# Patient Record
Sex: Male | Born: 1940 | Race: Black or African American | Hispanic: No | Marital: Single | Smoking: Never smoker
Health system: Southern US, Community
[De-identification: ages and names within clinical notes are randomized; demographics above are authoritative.]

## PROBLEM LIST (undated history)

## (undated) DIAGNOSIS — I10 Essential (primary) hypertension: Secondary | ICD-10-CM

## (undated) DIAGNOSIS — E119 Type 2 diabetes mellitus without complications: Secondary | ICD-10-CM

## (undated) DIAGNOSIS — I639 Cerebral infarction, unspecified: Secondary | ICD-10-CM

## (undated) DIAGNOSIS — K635 Polyp of colon: Secondary | ICD-10-CM

## (undated) HISTORY — DX: Polyp of colon: K63.5

## (undated) HISTORY — DX: Type 2 diabetes mellitus without complications: E11.9

## (undated) HISTORY — PX: NO PAST SURGERIES: SHX2092

---

## 1999-06-30 ENCOUNTER — Emergency Department (HOSPITAL_COMMUNITY): Admission: EM | Admit: 1999-06-30 | Discharge: 1999-06-30 | Payer: Self-pay | Admitting: Emergency Medicine

## 2005-06-20 ENCOUNTER — Ambulatory Visit: Payer: Self-pay | Admitting: Physical Medicine & Rehabilitation

## 2005-06-20 ENCOUNTER — Inpatient Hospital Stay (HOSPITAL_COMMUNITY): Admission: EM | Admit: 2005-06-20 | Discharge: 2005-06-23 | Payer: Self-pay | Admitting: Emergency Medicine

## 2005-06-21 ENCOUNTER — Encounter (INDEPENDENT_AMBULATORY_CARE_PROVIDER_SITE_OTHER): Payer: Self-pay | Admitting: Cardiology

## 2005-06-23 ENCOUNTER — Inpatient Hospital Stay (HOSPITAL_COMMUNITY)
Admission: RE | Admit: 2005-06-23 | Discharge: 2005-07-25 | Payer: Self-pay | Admitting: Physical Medicine & Rehabilitation

## 2005-07-26 ENCOUNTER — Ambulatory Visit: Payer: Self-pay | Admitting: Nurse Practitioner

## 2005-08-12 ENCOUNTER — Encounter
Admission: RE | Admit: 2005-08-12 | Discharge: 2005-11-10 | Payer: Self-pay | Admitting: Physical Medicine & Rehabilitation

## 2005-08-12 ENCOUNTER — Ambulatory Visit: Payer: Self-pay | Admitting: Physical Medicine & Rehabilitation

## 2005-08-16 ENCOUNTER — Ambulatory Visit: Payer: Self-pay | Admitting: *Deleted

## 2005-08-19 ENCOUNTER — Encounter
Admission: RE | Admit: 2005-08-19 | Discharge: 2005-11-17 | Payer: Self-pay | Admitting: Physical Medicine and Rehabilitation

## 2005-09-22 ENCOUNTER — Ambulatory Visit: Payer: Self-pay | Admitting: Nurse Practitioner

## 2005-10-12 ENCOUNTER — Ambulatory Visit: Payer: Self-pay | Admitting: Physical Medicine & Rehabilitation

## 2005-11-24 ENCOUNTER — Encounter
Admission: RE | Admit: 2005-11-24 | Discharge: 2006-02-22 | Payer: Self-pay | Admitting: Physical Medicine and Rehabilitation

## 2005-12-26 ENCOUNTER — Ambulatory Visit: Payer: Self-pay | Admitting: Nurse Practitioner

## 2005-12-27 ENCOUNTER — Emergency Department (HOSPITAL_COMMUNITY): Admission: EM | Admit: 2005-12-27 | Discharge: 2005-12-27 | Payer: Self-pay | Admitting: *Deleted

## 2006-01-06 ENCOUNTER — Encounter
Admission: RE | Admit: 2006-01-06 | Discharge: 2006-04-06 | Payer: Self-pay | Admitting: Physical Medicine & Rehabilitation

## 2006-02-09 ENCOUNTER — Ambulatory Visit: Payer: Self-pay | Admitting: Physical Medicine & Rehabilitation

## 2006-03-24 ENCOUNTER — Ambulatory Visit: Payer: Self-pay | Admitting: *Deleted

## 2006-07-28 ENCOUNTER — Ambulatory Visit: Payer: Self-pay | Admitting: Nurse Practitioner

## 2006-09-07 ENCOUNTER — Ambulatory Visit: Payer: Self-pay | Admitting: Nurse Practitioner

## 2007-06-27 ENCOUNTER — Ambulatory Visit: Payer: Self-pay | Admitting: Internal Medicine

## 2007-06-27 ENCOUNTER — Encounter (INDEPENDENT_AMBULATORY_CARE_PROVIDER_SITE_OTHER): Payer: Self-pay | Admitting: *Deleted

## 2007-06-27 ENCOUNTER — Encounter (INDEPENDENT_AMBULATORY_CARE_PROVIDER_SITE_OTHER): Payer: Self-pay | Admitting: Nurse Practitioner

## 2007-06-27 LAB — CONVERTED CEMR LAB
Basophils Absolute: 0 10*3/uL (ref 0.0–0.1)
Basophils Relative: 1 % (ref 0–1)
CO2: 24 meq/L (ref 19–32)
Eosinophils Absolute: 0.2 10*3/uL (ref 0.0–0.7)
Eosinophils Relative: 5 % (ref 0–5)
Glucose, Bld: 104 mg/dL — ABNORMAL HIGH (ref 70–99)
Hemoglobin: 14.1 g/dL (ref 13.0–17.0)
Lymphocytes Relative: 38 % (ref 12–46)
MCHC: 33.3 g/dL (ref 30.0–36.0)
MCV: 88.9 fL (ref 78.0–100.0)
Monocytes Absolute: 0.4 10*3/uL (ref 0.2–0.7)
Neutrophils Relative %: 47 % (ref 43–77)
PSA: 1.12 ng/mL (ref 0.10–4.00)
Platelets: 192 10*3/uL (ref 150–400)
Potassium: 4.3 meq/L (ref 3.5–5.3)
RDW: 14.4 % — ABNORMAL HIGH (ref 11.5–14.0)
Total Bilirubin: 0.6 mg/dL (ref 0.3–1.2)

## 2008-01-23 ENCOUNTER — Encounter (INDEPENDENT_AMBULATORY_CARE_PROVIDER_SITE_OTHER): Payer: Self-pay | Admitting: Nurse Practitioner

## 2008-01-23 ENCOUNTER — Ambulatory Visit: Payer: Self-pay | Admitting: Internal Medicine

## 2008-01-23 LAB — CONVERTED CEMR LAB
ALT: 21 units/L (ref 0–53)
AST: 18 units/L (ref 0–37)
Albumin: 4.5 g/dL (ref 3.5–5.2)
BUN: 15 mg/dL (ref 6–23)
Basophils Absolute: 0 10*3/uL (ref 0.0–0.1)
Basophils Relative: 1 % (ref 0–1)
CO2: 25 meq/L (ref 19–32)
Chloride: 104 meq/L (ref 96–112)
Creatinine, Ser: 0.89 mg/dL (ref 0.40–1.50)
Eosinophils Absolute: 0.2 10*3/uL (ref 0.0–0.7)
Eosinophils Relative: 4 % (ref 0–5)
Glucose, Bld: 112 mg/dL — ABNORMAL HIGH (ref 70–99)
LDL Cholesterol: 98 mg/dL (ref 0–99)
Lymphs Abs: 1.6 10*3/uL (ref 0.7–4.0)
MCHC: 33.7 g/dL (ref 30.0–36.0)
Microalb, Ur: 0.98 mg/dL (ref 0.00–1.89)
Platelets: 210 10*3/uL (ref 150–400)
Sodium: 142 meq/L (ref 135–145)
TSH: 1.173 microintl units/mL (ref 0.350–5.50)
Total Bilirubin: 0.6 mg/dL (ref 0.3–1.2)
Total CHOL/HDL Ratio: 2.7
Total Protein: 7.9 g/dL (ref 6.0–8.3)
Triglycerides: 103 mg/dL (ref <150)

## 2009-03-12 ENCOUNTER — Encounter (INDEPENDENT_AMBULATORY_CARE_PROVIDER_SITE_OTHER): Payer: Self-pay | Admitting: Internal Medicine

## 2009-03-12 ENCOUNTER — Ambulatory Visit: Payer: Self-pay | Admitting: Internal Medicine

## 2009-03-12 LAB — CONVERTED CEMR LAB
AST: 23 units/L (ref 0–37)
Alkaline Phosphatase: 69 units/L (ref 39–117)
Calcium: 9.5 mg/dL (ref 8.4–10.5)
Eosinophils Absolute: 0.2 10*3/uL (ref 0.0–0.7)
HCT: 41.7 % (ref 39.0–52.0)
Hemoglobin: 13.4 g/dL (ref 13.0–17.0)
MCHC: 32.1 g/dL (ref 30.0–36.0)
Neutrophils Relative %: 53 % (ref 43–77)
Sodium: 142 meq/L (ref 135–145)

## 2009-04-08 ENCOUNTER — Encounter (INDEPENDENT_AMBULATORY_CARE_PROVIDER_SITE_OTHER): Payer: Self-pay | Admitting: Internal Medicine

## 2009-04-08 ENCOUNTER — Ambulatory Visit: Payer: Self-pay | Admitting: Internal Medicine

## 2009-04-08 LAB — CONVERTED CEMR LAB
Cholesterol: 194 mg/dL (ref 0–200)
HDL: 71 mg/dL (ref >39)
LDL Cholesterol: 109 mg/dL — ABNORMAL HIGH (ref 0–99)
PSA: 1.47 ng/mL (ref 0.10–4.00)
Triglycerides: 71 mg/dL (ref <150)

## 2009-06-08 ENCOUNTER — Ambulatory Visit: Payer: Self-pay | Admitting: Internal Medicine

## 2009-06-18 ENCOUNTER — Ambulatory Visit: Payer: Self-pay | Admitting: Internal Medicine

## 2009-10-29 ENCOUNTER — Ambulatory Visit: Payer: Self-pay | Admitting: Internal Medicine

## 2009-12-10 ENCOUNTER — Ambulatory Visit: Payer: Self-pay | Admitting: Family Medicine

## 2009-12-10 ENCOUNTER — Encounter (INDEPENDENT_AMBULATORY_CARE_PROVIDER_SITE_OTHER): Payer: Self-pay | Admitting: Internal Medicine

## 2009-12-10 LAB — CONVERTED CEMR LAB
ALT: 25 units/L (ref 0–53)
AST: 21 units/L (ref 0–37)
Albumin: 4.2 g/dL (ref 3.5–5.2)
Alkaline Phosphatase: 73 units/L (ref 39–117)
Chloride: 103 meq/L (ref 96–112)
Cholesterol: 197 mg/dL (ref 0–200)
Creatinine, Ser: 0.99 mg/dL (ref 0.40–1.50)
LDL Cholesterol: 112 mg/dL — ABNORMAL HIGH (ref 0–99)
Sodium: 140 meq/L (ref 135–145)
Total Protein: 7.2 g/dL (ref 6.0–8.3)
Triglycerides: 116 mg/dL (ref <150)
VLDL: 23 mg/dL (ref 0–40)

## 2010-01-01 ENCOUNTER — Ambulatory Visit: Payer: Self-pay | Admitting: Internal Medicine

## 2011-02-25 NOTE — Assessment & Plan Note (Signed)
HISTORY OF PRESENT ILLNESS:  Kent Williams returns to the clinic today for  follow up evaluation.  He is a 70 year old adult male who suffered a right  hemispheric stroke resulting in right sided weakness approximately early  September 2006.  The patient continues to attend outpatient therapy at  [facility] including PT/OT two times per week.  He needs some help with  bathing, but is able to mostly dress himself at the present time.  He  reports that he is continent of bowel and bladder.  He reports that he gets  primary care through [facility].  He is not sure exactly what numbers he  has been getting with his blood sugar checks, but he has been doing that on  a regular basis. He is using a single point cane for ambulation at the  present time.  He continues to live alone and drove himself into the office  today.   MEDICATIONS:  1.  Glucophage XR 500 mg one tablet q.a.m. and two tablets q.p.m.  2.  Avandia 6 mg daily.  3.  Hydrochlorothiazide 50 mg daily.  4.  K-Dur 20 mEq daily.  5.  Avapro 300 mg daily.  6.  Aggrenox 25/200 one tablet b.i.d.  7.  Lipitor 20 mg one tablet q.h.s.   REVIEW OF SYSTEMS:  Positive for limb swelling, especially of the hand.   PHYSICAL EXAMINATION:  GENERAL APPEARANCE:  Reasonably well-appearing adult  male in no acute discomfort.  VITAL SIGNS:  Blood pressure 117/56, pulse 77, respirations 16, O2  saturation 99% on room air.  He is seated in a regular chair and ambulates  with hemiparetic gait, mildly on the left with a single point cane.  Right  upper and right lower extremity strength was 5/5.  Left upper extremity  strength distally was 2/5 and proximally 3-/5.  Left leg strength was 4+/5.   IMPRESSION:  1.  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  2.  Non-insulin-dependent diabetes mellitus.  3.  Hypertension.  4.  Dyslipidemia.   PLAN:  At the present time, the patient will continue in outpatient  therapies.  He  is making excellent progress overall, and I would anticipate  that he will continue to require some assistance with occasional bathing and  dressing, given his left arm use that he has at present.  Hopefully, there  will be some improvement.  He has made excellent improvement in terms of  ability.  He is using a single point cane for ambulation at the present  time.  He is continent of bowel and bladder and is able to take care of  himself at a basic level.   We will plan on seeing the patient in follow up in this office in  approximately three months time.  He will continue to get his primary care  through [facility].           ______________________________  Ellwood Dense, M.D.     DC/MedQ  D:  10/13/2005 10:27:21  T:  10/13/2005 10:55:32  Job #:  [id number]

## 2011-02-25 NOTE — Discharge Summary (Signed)
NAMEDOMNIC, Kent Williams               ACCOUNT NO.:  [id number]   MEDICAL RECORD NO.:  [id number]          PATIENT TYPE:  INP   LOCATION:  3736                         FACILITY:  [facility]   PHYSICIAN:  Pramod P. Pearlean Brownie, MD    DATE OF BIRTH:  10-10-1941   DATE OF ADMISSION:  06/20/2005  DATE OF DISCHARGE:  06/23/2005                                 DISCHARGE SUMMARY   ADMISSION DIAGNOSIS:  Stroke   DISCHARGE DIAGNOSES:  1.  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  2.  Uncontrolled diabetes.  3.  Hypertension.  4.  Hyperlipidemia.   HOSPITAL COURSE:  [name] is a 64-year African-American male who was  admitted with sudden onset of dysarthria and left hemiparesis for three  days. He presented beyond the time of acute intervention. Admission CT scan  of the head showed a subacute hypodensity in the right coronary area, the  possibility of an infarct. Admission labs were significant only for elevated  blood glucose of 346. The patient was admitted to the stroke unit for  telemetry monitoring, did not reveal cardiac arrhythmias. An MRI scan of the  brain the next day showed a right internal capsule infarction. 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 Carotid ultrasound  showed no significant stenosis. The right vertebral artery was not  visualized due to technical limitations. A 2-D echocardiogram showed normal  ejection fraction without any cardiac source of embolism.   Stroke labs revealed a significantly elevated hemoglobin A1c of 13.2.  Cholesterol was elevated at 277 and ADL at 131. The patient was started on  Lipitor for his elevated cholesterol and on Aggrenox for secondary stroke  prevention. He was continued on Avapro and hydrochlorothiazide, his home  blood pressure medications. He required Glucophage for his diabetes and  sliding scale  coverage with insulin. His sugars were controlled during  hospitalization. Initially his sugars were high, but after . medications  were adjusted they were better controlled. He was given, counseling for  diabetes control.  On exam the patient had mild left facial weakness and  significant left hemiparesis with grade 0/5 strength in left upper extremity  and grade 4/5 strength at the left hip and knees and 0/5 at the ankles.  He  was seen on consultation by physical, occupational, and speech therapies.  Rehabilitation was also consulted and they felt the patient was a good  candidate for inpatient rehab; and, hence, he was transferred in stable  condition to rehab on 06/23/2005.   DISCHARGE MEDICATIONS:  1.  Aggrenox 1 capsule daily for 2 weeks and then increase to twice a day.  2.  Avapro 150 mg daily.  3.  Lovenox 40 mg daily for thrombosis prophylaxis.  4.  Hydrochlorothiazide 50 mg daily.  5.  Glucophage 1000 mg at night.  6.  Zocor 40 mg daily.  7.  Sliding scale insulin coverage.   The patient was advised to followup with his primary physician, Dr. Adela Lank  Williams, in a few weeks and with Dr. Pearlean Brownie in his office in 2 months.           ______________________________  Kent Williams. Pearlean Brownie, MD     PPS/MEDQ  D:  06/23/2005  T:  06/23/2005  Job:  [id number]   cc:   Kent Williams, M.D.  [phone] [address]  [place]  [place] [address]  Fax: [phone]

## 2011-02-25 NOTE — Assessment & Plan Note (Signed)
[name] returns to the clinic today for follow-up evaluation.  He is a 70-  year-old adult male who suffered a right hemisphere stroke resulting in left-  sided weakness, arm more than leg and that occurred September 2006.  He has  finished all therapy at this time.  He continues to do a home exercise  program.  He can walk approximately a half mile before resting using a  single point cane but occasionally goes without the cane.  He does live  alone.  He eats out at food kitchen periodically and also drives his own car  to eat out periodically.  He reports that his recent blood sugars has been  approximately 93.  He does receive health care through [facility].   MEDICATIONS:  1.  Glucophage XR 500 mg 1 tablet q.a.m. and 2 tablets q.p.m.  2.  Avandia 6 mg daily.  3.  Hydrochlorothiazide 15 mg daily.  4.  K-Dur 20 mEq daily.  5.  Avapro 300 mg daily.  6.  Aggrenox 25/200 1 tablet b.i.d.  7.  Lipitor 20 mg q.h.s.   REVIEW OF SYSTEMS:  Noncontributory.   PHYSICAL EXAMINATION:  A reasonably well-appearing middle-aged adult male in  no acute discomfort.  Blood pressure 149/80 with pulse 80, respiratory rate  16 and O2 saturation 100% on room air.  Right upper and right lower  extremity strength was 5/5 throughout.  Left upper extremity strength with  2/5 distally and 3-/5 proximally.  Left lower extremity strength was 4+/5.   IMPRESSION:  1.  Status post right hemisphere stroke with left hemiplegia.  2.  Noninsulin-dependent diabetes mellitus.  3.  Hypertension.  4.  Dyslipidemia.   In the office today, no refill on medication is necessary.  He has completed  all therapy and I have encouraged him to do home exercise program.  He is to  walk with and without a single point cane at this point.  At this point, we  will plan on seeing him in follow up on an as needed basis.  We have  referred him back to [facility] for ongoing medical treatment.  He seems to  be stable overall and  hopefully can avoid future strokes if he manages his  blood pressure, lipid levels and noninsulin-dependent diabetes mellitus.           ______________________________  Ellwood Dense, M.D.     DC/MedQ  D:  02/10/2006 16:16:39  T:  02/11/2006 21:21:11  Job #:  [id number]

## 2011-02-25 NOTE — Discharge Summary (Signed)
Kent Williams, Kent Williams               ACCOUNT NO.:  [id number]   MEDICAL RECORD NO.:  [id number]          PATIENT TYPE:  IPS   LOCATION:  4032                         FACILITY:  [facility]   PHYSICIAN:  Greg Cutter, P.A. DATE OF BIRTH:  10/25/1940   DATE OF ADMISSION:  06/23/2005  DATE OF DISCHARGE:  07/25/2005                                 DISCHARGE SUMMARY   DISCHARGE DIAGNOSES:  1.  Right basal ganglia infarct.  2.  Hypertension.  3.  Diabetes mellitus type 2.  4.  Dyslipidemia.   HISTORY OF PRESENT ILLNESS:  Kent Williams is a 70 year old right-handed male  with history of hypertension, diabetes mellitus, no medications, no BP  medications x 3 months, no diabetic medications x 2 days, admitted September  11 with left-sided weakness and dizziness.  CT of the head done shows acute  chronic white matter disease.  MRI brain showed a white matter infarct with  __________ internal capsule, basal ganglia.  MRA showed no lesion.  Cardiac  __________ 2-D echo 55-65% without thrombus.  Carotid Doppler showed no  acute stenosis.  The patient was started on __________  the patient also  noted to have increased cholesterol at 277 and LDL 191 and was started on  Lipitor.  Hemoglobin A1c was elevated at admission at 13.2 and blood sugars  have continued to be variable.  BP has been labile and the patient was  started on HCTZ and nitroglycerin paste.  Currently, the patient continues  with left hemiparesis, mild dysarthria and impaired balance requiring total  assist for mobility, min to moderate assist for ADLs.  Rehab was consulted  for further therapy.   PAST MEDICAL HISTORY:  Significant for hypertension, DM type 2, medical  noncompliance secondary to financial issues, history of renal calculi  requiring lithotripsy.   ALLERGIES:  NO KNOWN DRUG ALLERGIES.   FAMILY HISTORY:  Questionable for cancer.   SOCIAL HISTORY:  The patient lives alone, working part time and was  independent prior  to admission.  Uses beer occasionally.  Does not use any  tobacco.   HOSPITAL COURSE:  Kent Williams was admitted to rehab on June 23, 2005 for inpatient therapy to consist of PT and OT and speech therapy.  After admission, the patient was maintained on Aggrenox prophylaxis and DVT  prophylaxis.  Blood pressures were monitored initially on __________, HCTZ  and nitro paste.  Diabetes was monitored with a.c. and h.s. CBG checks.  BP  medications were adjusted during his stay.  Blood pressures at time of  discharge showed improving control from 120s-140s systolics, 70-80s  diastolic.  Glucophage was titrated upward and his blood sugars at discharge  were 80s to 140s.  The patient's mood was stable and he participated well in  therapies.  Speech therapy evaluation showed the patient's basic and high  level comprehension to be intact, basic __________ expression to be intact  without signs of apraxia or dysarthria.  The patient continued with left  hemiparesis, slowly improving.  By time of discharge, the patient was  modified independent for transfers, modified independent for standing  balance, modified independent for ambulating greater than 200 feet with a  rolling walker, modified independent to navigate stairs.  He was supervision  for bathing, modified independent for upper body dressing, min assist for  lower body dressing in terms of donning sock and shoe.  He was modified  independent for toileting.  At time of discharge, he was noted to have  advanced from stage 3 in the left arm and stage 2 in hand with free finger  movement.   Further followup and therapies to include home health, PT, OT by [facility] at discharge.  The patient was set up to follow up with  [facility] to help assist with medications.  He was also set up with mobile  meals.   DISCHARGE HOME MEDICATIONS:  1.  Glucophage XR 500 mg in a.m. and 150 mg in p.m.  2.  Avandia 6 mg per day.  3.   HCTZ 50 mg a day.  4.  K-Dur 20 mEq a day.  5.  Avapro 300 mg a day.  6.  Aggrenox one p.o. b.i.d.  7.  Lipitor 20 mg q.h.s.   DIET:  Diabetic diet.   ACTIVITY:  As tolerated.  Use a walker.  Increase activity slowly.  No  driving for 4-6 weeks until followup with LMD.   SPECIAL INSTRUCTIONS:  1.  [facility] appointment October 16 at 2 p.m.  2.  [facility] eligibility appointment, Thursday October 19 at 3:15.  3.  [facility] to provide PT, OT, RN.   FOLLOWUP:  1.  The patient is to follow up with Dr. Thomasena Edis November 6 at 11:40 a.m.  2.  Follow up with Dr. Pearlean Brownie in 1-2 months.  3.  Follow up with LMD, [facility] for all medical issues.      Greg Cutter, P.A.     PP/MEDQ  D:  10/31/2005  T:  11/01/2005  Job:  [id number]   cc:   Dr. Carol Ada P. Pearlean Brownie, MD  Fax: [phone]

## 2011-02-25 NOTE — H&P (Signed)
Kent Williams, Kent Williams               ACCOUNT NO.:  [id number]   MEDICAL RECORD NO.:  [id number]          PATIENT TYPE:  INP   LOCATION:  3736                         FACILITY:  [facility]   PHYSICIAN:  Genene Churn. Love, M.D.    DATE OF BIRTH:  1940/12/01   DATE OF ADMISSION:  06/20/2005  DATE OF DISCHARGE:                                HISTORY & PHYSICAL   This is the first [facility] admission for this 70 year old right-  handed black single male from [place], [place] admitted for  evaluation of left-sided weakness.   HISTORY OF PRESENT ILLNESS:  Kent Williams has a 10-year history of  hypertension and a 5-year history of diabetes mellitus without a known  history of stroke or heart disease. He has not taken his blood pressure in  over one to three months. He has not taken his diabetic medication in three  days.   Today, about 2:30 p.m. while at [employer], he developed the onset of  dizziness described as a vague lightheaded sensation without associated  headaches, syncope, or seizure followed by left-sided weakness, and was  brought to the [facility] Emergency Room. He has not been on any  aspirin therapy.   PAST MEDICAL HISTORY:  1.  Diabetes mellitus for five years.  2.  Hypertension for ten years.  3.  Kidney stones ten years ago.   ALLERGIES:  He has no known history of allergies.   SOCIAL HISTORY:  He does not smoke cigarettes. Education:  He went through  [employer] for two years and [employer] for two years but did  not graduate. 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 He is unmarried.   MEDICATIONS:  1.  Glucophage dosage 1 daily.  2.  Blood pressure unknown.  3.  No aspirin.   FAMILY HISTORY:  His mother died at 61 of unknown causes. His father died at  24 following an ulcer operation. He has a brother 68 living and well, a  brother 68 living and well. He had a brother who died at 62 of  unknown  causes. Brother died at 68 unknown causes, possibly lung cancer. He has a  sister who is 84 and 32 living and well.   PAST MEDICAL HISTORY:  Reveals that he has been hospitalized 10 years ago  for kidney stones and had what sounds to have been lithotripsy or basket  extraction.   PHYSICAL EXAMINATION:  GENERAL:  Well-developed black male.  VITAL SIGNS:  Blood pressure right arm was 210/100, left arm 190/100, heart  rate was 92 and regular. There were no bruits.  NEUROLOGICAL:  He was alert and oriented x2. There was no aphasia, no  apraxia. Cranial nerve examination revealed visual fields full, disks flat.  He had a left 7th. Tongue was to the left. He had pinprick equal in the  face. Hearing was intact. Air conduction was greater than bone conduction.  He had some decrease left shoulder shrug. Motor examination revealed a left  hand and  arm drift. He had mild left leg drift and inability to lift his  left arm versus gravity. He had no evidence of any sensory loss. Deep tendon  reflexes were 2+. Left plantar responses were upgoing. Right plantar  response was downgoing. The weakness was all on the left side.  HEENT:  Tympanic membrane clear.  LUNGS:  Clear to P&A.  HEART:  No murmurs.  ABDOMEN:  Bowel sounds were normal. There was no enlarged liver, spleen, or  kidneys.  GENITAL:  He was not circumcised.   LABORATORY DATA:  CT scan showed lacunar strokes, ischemic in the right  corona radiata. EKG showed normal sinus rhythm, nonspecific ST and T-wave  changes with an INR of 0.9, PTT 25. Sodium 134, potassium 4.0, chloride 104,  CO2 content 12, glucose 346. His BUN was 12. His white blood cell count was  5500, hemoglobin 14.4, hematocrit 43.1, platelet count 159,000.   IMPRESSION:  1.  Right brain small vessel disease, lacunar stroke, code 434.1.  2.  Hypertension uncontrolled, code 796.2.  3.  Diabetes mellitus, uncontrolled, code 250.60.  4.  Obesity, code 278.01.  5.   Noncompliance with medications.  6.  History of renal stones.   PLAN:  Plan at this time is to admit the patient, give him IV labetalol push  and see if he is a candidate for any of the studies today. If not, I will  place him on rectal aspirin and Lovenox.           ______________________________  Genene Churn. Sandria Manly, M.D.     JML/MEDQ  D:  06/20/2005  T:  06/21/2005  Job:  [id number]   cc:   Lorelle Formosa, M.D.  [phone] [address]  [place]  [place] [address]  Fax: [phone]

## 2011-02-25 NOTE — Assessment & Plan Note (Signed)
HISTORY OF PRESENT ILLNESS:  Kent Williams returns to the clinic today for  follow up evaluation.  He is a 70 year old adult male who has suffered an  obvious right hemispheric stroke resulting in left hemiplegia.  Unfortunately, I do not have a discharge summary available as of August 12, 2005, and therefore, I do not have all the specifics regarding his  hospitalization.  It does appear that he was on the rehabilitation unit  starting June 23, 2005, but I do not have a discharge date.  He was set  up to be seen in the office today, and also was set up with [facility] as  of July 25, 2005.  He did go to [facility] and has received his  medicines through their office.   The patient does need a refill on his Aggrenox in the office today.  He  reports that he has not been able to check his blood sugar and has not been  scheduled for a follow up appointment with [facility].  They apparently  told him that when he runs low on his medications, he needs to call several  days in advance.  He is also being treated with Avapro and  hydrochlorothiazide for blood pressure control and Lipitor for  hypercholesterolemia.   Unfortunately, Home Health therapy tried to get into the patient's home, but  it was too small.  He reports that he has cleaned up his home some, and they  may be able to manage getting in, but he also reports that he has  transportation available for outpatient therapies.   MEDICATIONS:  1.  Glucophage XR 500 mg one tablet q.a.m. and three tablets q.p.m.  2.  Avandia 6 mg one tablet daily.  3.  Hydrochlorothiazide 50 mg one tablet daily.  4.  K-Dur 20 mEq one tablet daily.  5.  Avapro 300 mg one tablet daily.  6.  Aggrenox 25/200 one tablet b.i.d.  7.  Lipitor 20 mg one tablet p.o. q.h.s.   REVIEW OF SYSTEMS:  Noncontributory.   PHYSICAL EXAMINATION:  GENERAL APPEARANCE:  Reasonably well-appearing,  middle-aged adult male in no acute discomfort.  He is seated  in a regular  wheelchair and is able to use the rolling walker with wrist brace on his  left side to strap his left hand around the walker.  He probably has 4+/5  strength in hip flexion and knee extension on the left side, and his left  upper extremity shows 0/5 in grip with elbow flexion of 4/5 and elbow  extension of 4-/5.  Shoulder abduction was 3-/5 on the left.   IMPRESSION:  1.  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  2.  Non-insulin-dependent diabetes mellitus.  3.  Hypertension.  4.  Hyperlipidemia.   PLAN:  In the office today, we did set the patient up for a follow up  appointment in this office in two months.  We also set him up for outpatient  PT/OT on [address]  We have refilled his Aggrenox.  We have asked him  to follow up with [facility] for management of his hypertension and  diabetes along with refill of medication.  We plan on seeing him in follow  up in this office in approximately two months time.          ______________________________  Kent Williams, M.D.    DC/MedQ  D:  08/15/2005 12:07:04  T:  08/15/2005 12:41:41  Job #:  [id number]

## 2013-11-18 ENCOUNTER — Emergency Department (HOSPITAL_COMMUNITY): Payer: No Typology Code available for payment source

## 2013-11-18 ENCOUNTER — Emergency Department (HOSPITAL_COMMUNITY)
Admission: EM | Admit: 2013-11-18 | Discharge: 2013-11-18 | Disposition: A | Discharge status: Home / Self Care | Payer: No Typology Code available for payment source | Attending: Emergency Medicine | Admitting: Emergency Medicine

## 2013-11-18 ENCOUNTER — Encounter (HOSPITAL_COMMUNITY): Payer: Self-pay | Admitting: Emergency Medicine

## 2013-11-18 DIAGNOSIS — S99919A Unspecified injury of unspecified ankle, initial encounter: Principal | ICD-10-CM

## 2013-11-18 DIAGNOSIS — Y92410 Unspecified street and highway as the place of occurrence of the external cause: Secondary | ICD-10-CM | POA: Insufficient documentation

## 2013-11-18 DIAGNOSIS — IMO0002 Reserved for concepts with insufficient information to code with codable children: Secondary | ICD-10-CM | POA: Insufficient documentation

## 2013-11-18 DIAGNOSIS — Z8673 Personal history of transient ischemic attack (TIA), and cerebral infarction without residual deficits: Secondary | ICD-10-CM | POA: Insufficient documentation

## 2013-11-18 DIAGNOSIS — M25561 Pain in right knee: Secondary | ICD-10-CM

## 2013-11-18 DIAGNOSIS — M25519 Pain in unspecified shoulder: Secondary | ICD-10-CM

## 2013-11-18 DIAGNOSIS — S99929A Unspecified injury of unspecified foot, initial encounter: Principal | ICD-10-CM

## 2013-11-18 DIAGNOSIS — Z7982 Long term (current) use of aspirin: Secondary | ICD-10-CM | POA: Insufficient documentation

## 2013-11-18 DIAGNOSIS — Z79899 Other long term (current) drug therapy: Secondary | ICD-10-CM | POA: Insufficient documentation

## 2013-11-18 DIAGNOSIS — M25562 Pain in left knee: Secondary | ICD-10-CM

## 2013-11-18 DIAGNOSIS — S4980XA Other specified injuries of shoulder and upper arm, unspecified arm, initial encounter: Secondary | ICD-10-CM | POA: Insufficient documentation

## 2013-11-18 DIAGNOSIS — S8990XA Unspecified injury of unspecified lower leg, initial encounter: Secondary | ICD-10-CM | POA: Insufficient documentation

## 2013-11-18 DIAGNOSIS — Y9389 Activity, other specified: Secondary | ICD-10-CM | POA: Insufficient documentation

## 2013-11-18 DIAGNOSIS — I10 Essential (primary) hypertension: Secondary | ICD-10-CM | POA: Insufficient documentation

## 2013-11-18 DIAGNOSIS — M545 Low back pain, unspecified: Secondary | ICD-10-CM

## 2013-11-18 DIAGNOSIS — S46909A Unspecified injury of unspecified muscle, fascia and tendon at shoulder and upper arm level, unspecified arm, initial encounter: Secondary | ICD-10-CM | POA: Insufficient documentation

## 2013-11-18 HISTORY — DX: Essential (primary) hypertension: I10

## 2013-11-18 HISTORY — DX: Cerebral infarction, unspecified: I63.9

## 2013-11-18 MED ORDER — OXYCODONE-ACETAMINOPHEN 5-325 MG PO TABS
1.0000 | ORAL_TABLET | Freq: Once | ORAL | Status: AC
  Administered 2013-11-18: 1 via ORAL
  Filled 2013-11-18: qty 1

## 2013-11-18 MED ORDER — OXYCODONE-ACETAMINOPHEN 5-325 MG PO TABS: 1.0000 | ORAL_TABLET | Freq: Four times a day (QID) | ORAL | Status: DC | PRN

## 2013-11-18 NOTE — ED Notes (Signed)
Pt state he also has R shoulder pain

## 2013-11-18 NOTE — Discharge Instructions (Signed)
Return to the ED with any concerns including weakness of legs, not able to urinate, loss of control of bowel or bladder, chest or abdominal pain, difficulty breathing, decreased level of alertness/lethargy, or any other alarming symptoms  The xray of your back had some chronic appearing abnormalitlies, no acute fractures were found,  If your back continues to cause pain you should be sure to see your doctor for a recheck
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
## 2016-12-12 ENCOUNTER — Emergency Department (HOSPITAL_COMMUNITY)
Admission: EM | Admit: 2016-12-12 | Discharge: 2016-12-12 | Disposition: A | Discharge status: Home / Self Care | Payer: Medicare HMO | Attending: Emergency Medicine | Admitting: Emergency Medicine

## 2016-12-12 ENCOUNTER — Encounter (HOSPITAL_COMMUNITY): Payer: Self-pay

## 2016-12-12 DIAGNOSIS — Z7984 Long term (current) use of oral hypoglycemic drugs: Secondary | ICD-10-CM | POA: Diagnosis not present

## 2016-12-12 DIAGNOSIS — L259 Unspecified contact dermatitis, unspecified cause: Secondary | ICD-10-CM | POA: Diagnosis not present

## 2016-12-12 DIAGNOSIS — I10 Essential (primary) hypertension: Secondary | ICD-10-CM | POA: Diagnosis not present

## 2016-12-12 DIAGNOSIS — Z7982 Long term (current) use of aspirin: Secondary | ICD-10-CM | POA: Diagnosis not present

## 2016-12-12 DIAGNOSIS — R21 Rash and other nonspecific skin eruption: Secondary | ICD-10-CM | POA: Diagnosis present

## 2016-12-12 DIAGNOSIS — Z8673 Personal history of transient ischemic attack (TIA), and cerebral infarction without residual deficits: Secondary | ICD-10-CM | POA: Diagnosis not present

## 2016-12-12 DIAGNOSIS — Z79899 Other long term (current) drug therapy: Secondary | ICD-10-CM | POA: Insufficient documentation

## 2016-12-12 MED ORDER — TRIAMCINOLONE ACETONIDE 0.1 % EX CREA
TOPICAL_CREAM | Freq: Once | CUTANEOUS | Status: AC
  Administered 2016-12-12: 03:00:00 via TOPICAL
  Filled 2016-12-12: qty 15

## 2016-12-12 NOTE — ED Triage Notes (Signed)
Pt complaining of rash to L arm. Pt states believes is shingles. Pt with OLD stroke, L sided deficits. Pt complaining of itching rash, denies any pain.

## 2016-12-12 NOTE — ED Provider Notes (Signed)
[facility]-EMERGENCY DEPT Provider Note   CSN: [id number] Arrival date & time: 12/12/16  0214  By signing my name below, I, Kent Williams, attest that this documentation has been prepared under the direction and in the presence of Kent Crumble, MD. Electronically Signed: Modena Williams, Scribe. 12/12/2016. 2:22 AM.  History   Chief Complaint Chief Complaint  Patient presents with  . Rash   The history is provided by the patient. No language interpreter was used.   HPI Comments: Kent Williams is a 76 y.o. male who presents to the Emergency Department complaining of a constant moderate LUE rash that started about a week ago. He states he is concerned about shingles. He applied Hydrocortisone cream with some relief. He describes his rash as itchy and painful ("slightly"). He reports associated LUE and LLE weakness from prior stroke. He denies any other complaints.     PCP: [name] (Inactive)  Past Medical History:  Diagnosis Date  . Hypertension   . Stroke [facility])     There are no active problems to display for this patient.   History reviewed. No pertinent surgical history.     Home Medications    Prior to Admission medications   Medication Sig Start Date End Date Taking? Authorizing Provider  aspirin 81 MG chewable tablet Chew 81 mg by mouth daily.    Historical Provider, MD  chlorthalidone (HYGROTON) 25 MG tablet Take 25 mg by mouth daily.    Historical Provider, MD  diltiazem (DILACOR XR) 120 MG 24 hr capsule Take 120 mg by mouth at bedtime.    Historical Provider, MD  diltiazem (DILACOR XR) 180 MG 24 hr capsule Take 180 mg by mouth every morning.    Historical Provider, MD  glipiZIDE (GLUCOTROL XL) 2.5 MG 24 hr tablet Take 2.5 mg by mouth at bedtime.    Historical Provider, MD  ibuprofen (ADVIL,MOTRIN) 200 MG tablet Take 200 mg by mouth every 6 (six) hours as needed for moderate pain.    Historical Provider, MD  losartan (COZAAR) 100 MG tablet Take 100 mg by mouth  daily.    Historical Provider, MD  metFORMIN (GLUCOPHAGE) 1000 MG tablet Take 1,000 mg by mouth 2 (two) times daily with a meal.    Historical Provider, MD  oxyCODONE-acetaminophen (PERCOCET/ROXICET) 5-325 MG per tablet Take 1-2 tablets by mouth every 6 (six) hours as needed for severe pain. 11/18/13   [name], MD  pravastatin (PRAVACHOL) 10 MG tablet Take 10 mg by mouth at bedtime.    Historical Provider, MD    Family History History reviewed. No pertinent family history.  Social History Social History  Substance Use Topics  . Smoking status: Never Smoker  . Smokeless tobacco: Never Used  . Alcohol use No     Allergies   Patient has no known allergies.   Review of Systems Review of Systems A complete 10 system review of systems was obtained and all systems are negative except as noted in the HPI and PMH.   Physical Exam Updated Vital Signs BP 140/97 (BP Location: Right Arm)   Pulse 65   Temp 97.8 F (36.6 C) (Oral)   Resp 16   SpO2 97%   Physical Exam  Constitutional: He is oriented to person, place, and time. Vital signs are normal. He appears well-developed and well-nourished.  Non-toxic appearance. He does not appear ill. No distress.  HENT:  Head: Normocephalic and atraumatic.  Nose: Nose normal.  Mouth/Throat: Oropharynx is clear and moist. No oropharyngeal exudate.  Eyes: Conjunctivae and EOM are normal. Pupils are equal, round, and reactive to light. No scleral icterus.  Neck: Normal range of motion. Neck supple. No tracheal deviation, no edema, no erythema and normal range of motion present. No thyroid mass and no thyromegaly present.  Cardiovascular: Normal rate, regular rhythm, S1 normal, S2 normal, normal heart sounds, intact distal pulses and normal pulses.  Exam reveals no gallop and no friction rub.   No murmur heard. Pulmonary/Chest: Effort normal and breath sounds normal. No respiratory distress. He has no wheezes. He has no rhonchi. He has no rales.    Abdominal: Soft. Normal appearance and bowel sounds are normal. He exhibits no distension, no ascites and no mass. There is no hepatosplenomegaly. There is no tenderness. There is no rebound, no guarding and no CVA tenderness.  Musculoskeletal: Normal range of motion. He exhibits no edema or tenderness.  Lymphadenopathy:    He has no cervical adenopathy.  Neurological: He is alert and oriented to person, place, and time. He has normal strength. No cranial nerve deficit or sensory deficit.  LUE and LLE weakness.   Skin: Skin is warm, dry and intact. Rash noted. No petechiae noted. He is not diaphoretic. No erythema. No pallor.  LUE: Very small 2 cm circular rash. Rash is skin-colored, flat, and not draining. No warmth or TTP.   Nursing note and vitals reviewed.    ED Treatments / Results  DIAGNOSTIC STUDIES: Oxygen Saturation is 97% on RA, normal by my interpretation.    COORDINATION OF CARE: 2:26 AM- Pt advised of plan for treatment and pt agrees.  Labs (all labs ordered are listed, but only abnormal results are displayed) Labs Reviewed - No data to display  EKG  EKG Interpretation None       Radiology No results found.  Procedures Procedures (including critical care time)  Medications Ordered in ED Medications - No data to display   Initial Impression / Assessment and Plan / ED Course  I have reviewed the triage vital signs and the nursing notes.  Pertinent labs & imaging results that were available during my care of the patient were reviewed by me and considered in my medical decision making (see chart for details).     Patient presents to the ED for a rash.  It appears to be a contact dermatitis.  HE is concerned for shingles, however he has described this same rash as being on his head, his chest, and has now moved to his arm.  Hydrocortisone cream helps but does not get rid of it.  Will DC with triamcinolone cream to take at home.  PCP fu advised within 3 days.   He appears well and in NAD.  VS remain within his normal limits and he is safe for DC.  Final Clinical Impressions(s) / ED Diagnoses   Final diagnoses:  None    New Prescriptions New Prescriptions   No medications on file   I personally performed the services described in this documentation, which was scribed in my presence. The recorded information has been reviewed and is accurate.      Kent CrumbleAdeleke Yoshiharu Brassell, MD 12/12/16 [phone]

## 2019-02-08 DEATH — deceased

## 2021-02-08 ENCOUNTER — Ambulatory Visit (INDEPENDENT_AMBULATORY_CARE_PROVIDER_SITE_OTHER): Payer: No Typology Code available for payment source | Admitting: Gastroenterology

## 2021-02-08 ENCOUNTER — Encounter: Payer: Self-pay | Admitting: Gastroenterology

## 2021-02-08 VITALS — BP 122/70 | HR 80 | Ht 73.0 in | Wt 259.0 lb

## 2021-02-08 DIAGNOSIS — Z8673 Personal history of transient ischemic attack (TIA), and cerebral infarction without residual deficits: Secondary | ICD-10-CM

## 2021-02-08 DIAGNOSIS — D508 Other iron deficiency anemias: Secondary | ICD-10-CM

## 2021-02-08 MED ORDER — PEG 3350-KCL-NA BICARB-NACL 420 G PO SOLR: 4000.0000 mL | Freq: Once | ORAL | 0 refills | Status: AC

## 2021-02-08 NOTE — Progress Notes (Signed)
Kent Williams Consult Note:  History: Kent Williams 02/08/2021  Referring provider: Gwinda Passe, PA [facility])  Reason for consult/chief complaint: Colonoscopy (2 past Colonoscopies, unsure when the last one was, [facility]. His last last 2 colonoscopies were 5 years  apart. He has been referred due to low iron and was told he needed a Colonoscopy and Endoscopy. His first Colon he did have polyps and  did not have any on his seconds.)   Subjective  HPI:  This is a very pleasant 80 year old man referred by the [place] [facility].  [facility] indicate [facility] reported an episode of constipation with some blood on the paper occurring during that time, both issues been resolved. Clinic note refers to a September 2016 colonoscopy revealing a polyp at the hepatic flexure, path showing tubular adenoma (unknown size of polyp).  October 2011 colonoscopy with an inflammatory rectal polyp removed. Normal EGD in October 2011.  [facility] indicates that [facility] studies will be performed and EGD also requested if patient was iron deficient.  No iron studies are included with these records.  Kent Williams says he has increased his fiber intake and now has a BM about 5 days a week.  He has not had any bleeding since that episode noted above.  His appetite is good, and he denies nausea or vomiting.  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  He had a CVA in 2006 and says he has remained on aspirin since then and has not had any recurrent neurologic events no other progression of his neurologic residual deficit since then.  [name] has an at home caregiver several days a week (Kent Williams), who we brought in from the waiting room for today's visit to discuss Kent Williams's plan. He denies a history of congestive heart failure, heart attack or  coronary stent.  He denies any chronic lung or kidney problems. He tells me that subsequent labs found to be iron deficient and he was prescribed iron, but through some scheduling and logistical issues at the [place], was only able to started about a month ago.  ROS:  Review of Systems  Constitutional: Negative for appetite change and unexpected weight change.  HENT: Negative for mouth sores and voice change.   Eyes: Negative for pain and redness.  Respiratory: Negative for cough and shortness of breath.   Cardiovascular: Negative for chest pain and palpitations.  Genitourinary: Negative for dysuria and hematuria.  Musculoskeletal: Negative for arthralgias and myalgias.  Skin: Negative for pallor and rash.  Neurological: Negative for weakness and headaches.       Left hemiparesis.  He is in a wheelchair most of the time, he is able to take a few steps with some assistance, but is unable to use a walker effectively due to his hemiparesis.  Hematological: Negative for adenopathy.     Past Medical History: Past Medical History:  Diagnosis Date  . Colon polyp   . Diabetes (HCC)   . Hypertension   . Stroke [facility])      Past Surgical History: Past Surgical History:  Procedure Laterality Date  . NO PAST SURGERIES    No prior surgery that he recalls and he has no abdominal scars   Family History: Family History  Problem Relation Age of Onset  . Rheum arthritis Mother   . Hypertension Mother   .  Ulcers Father     Social History: Social History   Socioeconomic History  . Marital status: Single    Spouse name: Not on file  . Number of children: Not on file  . Years of education: Not on file  . Highest education level: Not on file  Occupational History  . Not on file  Tobacco Use  . Smoking status: Never Smoker  . Smokeless tobacco: Never Used  Vaping Use  . Vaping Use: Never used  Substance and Sexual Activity  . Alcohol use: No  . Drug use: No  . Sexual activity: Not on  file  Other Topics Concern  . Not on file  Social History Narrative  . Not on file   Social Determinants of Health   Financial Resource Strain: Not on file  Food Insecurity: Not on file  Transportation Needs: Not on file  Physical Activity: Not on file  Stress: Not on file  Social Connections: Not on file   He was in [employer] service in the early 1960s.  Allergies: No Known Allergies  Outpatient Meds: Current Outpatient Medications  Medication Sig Dispense Refill  . aspirin 81 MG chewable tablet Chew 81 mg by mouth daily.    . chlorthalidone (HYGROTON) 25 MG tablet Take 25 mg by mouth daily.    Marland Kitchen diltiazem (DILACOR XR) 120 MG 24 hr capsule Take 120 mg by mouth at bedtime.    Marland Kitchen losartan (COZAAR) 100 MG tablet Take 100 mg by mouth daily.    . metFORMIN (GLUCOPHAGE) 1000 MG tablet Take 1,000 mg by mouth 2 (two) times daily with a meal.    . pravastatin (PRAVACHOL) 10 MG tablet Take 10 mg by mouth at bedtime.    Marland Kitchen diltiazem (DILACOR XR) 180 MG 24 hr capsule Take 180 mg by mouth every morning.     No current facility-administered medications for this visit.      ___________________________________________________________________ Objective   Exam:  BP 122/70   Pulse 80   Ht 6\' 1"  (1.854 m)   Wt 259 lb (117.5 kg)   BMI 34.17 kg/m  Wt Readings from Last 3 Encounters:  02/08/21 259 lb (117.5 kg)     General: Pleasant and conversational.  Wheelchair-bound with dense left hemiparesis.  Eyes: sclera anicteric, no redness  ENT: oral mucosa moist without lesions, no cervical or supraclavicular lymphadenopathy  CV: RRR without murmur, S1/S2, no JVD, trace bilateral pretibial edema  Resp: clear to auscultation bilaterally, normal RR and effort noted  GI: soft, obese, no tenderness, with active bowel sounds. No guarding or palpable organomegaly noted, though limited exam in wheelchair  Skin; warm and dry, no rash or jaundice noted Labs:  [facility] labs from  01/22/2020:  WBC 5.6, hemoglobin 11.3, hematocrit 35, MCV 89, platelet 201 BUN 27, creatinine 1.4 sodium 132, potassium 3.8, chloride 102, bicarb 25, calcium 9.0 , Albumin 3.4, AST 6, ALT 16, alkaline phosphatase 96, total bilirubin 0.2   Radiologic Studies:  Abdominal imaging  Assessment: Encounter Diagnoses  Name Primary?  . Other iron deficiency anemia Yes  . History of CVA (cerebrovascular accident)     Normocytic anemia with reportedly low iron levels (though no iron levels accompanied the referral).  Episode of constipation with some blood on the paper.  No chronic digestive symptoms other than mild constipation that is reportedly improved after increasing his daily fiber intake.  It is not yet clear if this anemia is related from chronic occult GI blood loss.  An upper endoscopy and  colonoscopy are reasonable to rule that out because if no source is found, his primary care provider can pursue additional work-up for anemia. Mild CKD noted.  Dense left hemiparesis with mobility impairment after CVA over 15 years ago.  This makes bowel preparation challenging for him, and his procedures need to be done in the outpatient endoscopy lab. Is at home caregiver Kent Williams would need to help him with the bowel preparation, and he was agreeable to that.   The benefits and risks of the planned procedure were described in detail with the patient or (when appropriate) their health care proxy.  Risks were outlined as including, but not limited to, bleeding, infection, perforation, adverse medication reaction leading to cardiac or pulmonary decompensation, pancreatitis (if ERCP).  The limitation of incomplete mucosal visualization was also discussed.  No guarantees or warranties were given. Patient at increased risk for cardiopulmonary complications of procedure due to medical comorbidities.  Cristofer was agreeable to the plan and all questions were answered  (Medically complex patient, nearly 50 pages  of records reviewed from [place], logistics of his endoscopic scheduling and preparation as noted above)  Thank you for the courtesy of this consult.  Please call me with any questions or concerns.  Charlie Pitter III  CC: Referring provider noted above

## 2021-02-08 NOTE — Patient Instructions (Signed)
If you are age 78 or older, your body mass index should be between 23-30. Your Body mass index is 34.17 kg/m. If this is out of the aforementioned range listed, please consider follow up with your Primary Care Provider.  If you are age 13 or younger, your body mass index should be between 19-25. Your Body mass index is 34.17 kg/m. If this is out of the aformentioned range listed, please consider follow up with your Primary Care Provider.   You have been scheduled for a colonoscopy. Please follow written instructions given to you at your visit today.  Please pick up your prep supplies at the pharmacy within the next 1-3 days. If you use inhalers (even only as needed), please bring them with you on the day of your procedure.  It was a pleasure to see you today!  Thank you for trusting me with your gastrointestinal care!

## 2021-02-15 ENCOUNTER — Other Ambulatory Visit: Payer: Self-pay

## 2021-02-15 MED ORDER — PEG 3350-KCL-NA BICARB-NACL 420 G PO SOLR: 4000.0000 mL | Freq: Once | ORAL | 0 refills | Status: DC

## 2021-03-04 ENCOUNTER — Telehealth: Payer: Self-pay | Admitting: Gastroenterology

## 2021-03-04 ENCOUNTER — Telehealth: Payer: Self-pay

## 2021-03-04 NOTE — Progress Notes (Signed)
Attempted to obtain medical history via telephone, unable to reach at this time. I left a voicemail to return pre surgical testing department's phone call.  

## 2021-03-04 NOTE — Telephone Encounter (Signed)
Lm on vm for patient to return call to confirm EGD/colon appt at [facility] on 03/11/21.

## 2021-03-04 NOTE — Telephone Encounter (Signed)
Pt called to confirm his procedure on 03/11/21. Thank you  

## 2021-03-05 NOTE — Telephone Encounter (Signed)
Noted, thanks!

## 2021-03-05 NOTE — Telephone Encounter (Signed)
Pt called to confirm his procedure on 03/11/21. Thank you

## 2021-03-08 ENCOUNTER — Other Ambulatory Visit (HOSPITAL_COMMUNITY): Payer: Non-veteran care

## 2021-03-09 ENCOUNTER — Other Ambulatory Visit (HOSPITAL_COMMUNITY): Payer: Non-veteran care

## 2021-03-10 ENCOUNTER — Other Ambulatory Visit: Payer: Self-pay

## 2021-03-10 ENCOUNTER — Telehealth: Payer: Self-pay | Admitting: Gastroenterology

## 2021-03-10 MED ORDER — PEG 3350-KCL-NA BICARB-NACL 420 G PO SOLR: 4000.0000 mL | Freq: Once | ORAL | 0 refills | Status: AC

## 2021-03-10 NOTE — Telephone Encounter (Signed)
Inbound call from patient requesting prep medication be sent to [facility] on [address] please.

## 2021-03-10 NOTE — Telephone Encounter (Signed)
Patient called in again, he states that he did not receive his prep. He states that he asked that this be [facility] to [facility] on [address]. Prescription has been resent in. Advised patient to give the pharmacy time to get it together and call prior to going to pick it up. Asked patient if he would be able to move up his appt time tomorrow. Patient states that he can't come in any earlier because his caregiver does not get there until 10 am.

## 2021-03-11 ENCOUNTER — Encounter (HOSPITAL_COMMUNITY): Payer: Self-pay | Admitting: Gastroenterology

## 2021-03-11 ENCOUNTER — Other Ambulatory Visit: Payer: Self-pay

## 2021-03-11 ENCOUNTER — Ambulatory Visit (HOSPITAL_COMMUNITY): Payer: No Typology Code available for payment source | Admitting: Certified Registered"

## 2021-03-11 ENCOUNTER — Encounter (HOSPITAL_COMMUNITY)
Admission: RE | Disposition: A | Discharge status: Home / Self Care | Payer: Self-pay | Source: Home / Self Care | Attending: Gastroenterology

## 2021-03-11 ENCOUNTER — Ambulatory Visit (HOSPITAL_COMMUNITY)
Admission: RE | Admit: 2021-03-11 | Discharge: 2021-03-11 | Disposition: A | Discharge status: Home / Self Care | Payer: No Typology Code available for payment source | Attending: Gastroenterology | Admitting: Gastroenterology

## 2021-03-11 DIAGNOSIS — K2950 Unspecified chronic gastritis without bleeding: Secondary | ICD-10-CM

## 2021-03-11 DIAGNOSIS — D508 Other iron deficiency anemias: Secondary | ICD-10-CM

## 2021-03-11 DIAGNOSIS — D509 Iron deficiency anemia, unspecified: Secondary | ICD-10-CM

## 2021-03-11 DIAGNOSIS — K2970 Gastritis, unspecified, without bleeding: Secondary | ICD-10-CM | POA: Insufficient documentation

## 2021-03-11 DIAGNOSIS — Z7984 Long term (current) use of oral hypoglycemic drugs: Secondary | ICD-10-CM | POA: Diagnosis not present

## 2021-03-11 DIAGNOSIS — K648 Other hemorrhoids: Secondary | ICD-10-CM | POA: Diagnosis not present

## 2021-03-11 DIAGNOSIS — E669 Obesity, unspecified: Secondary | ICD-10-CM | POA: Diagnosis not present

## 2021-03-11 DIAGNOSIS — Z86010 Personal history of colonic polyps: Secondary | ICD-10-CM | POA: Insufficient documentation

## 2021-03-11 DIAGNOSIS — Q438 Other specified congenital malformations of intestine: Secondary | ICD-10-CM | POA: Insufficient documentation

## 2021-03-11 HISTORY — PX: COLONOSCOPY WITH PROPOFOL: SHX5780

## 2021-03-11 HISTORY — PX: ESOPHAGOGASTRODUODENOSCOPY (EGD) WITH PROPOFOL: SHX5813

## 2021-03-11 HISTORY — PX: BIOPSY: SHX5522

## 2021-03-11 LAB — POCT I-STAT, CHEM 8
BUN: 26 mg/dL — ABNORMAL HIGH (ref 8–23)
Calcium, Ion: 1.14 mmol/L — ABNORMAL LOW (ref 1.15–1.40)
Chloride: 105 mmol/L (ref 98–111)
Creatinine, Ser: 1.2 mg/dL (ref 0.61–1.24)
Glucose, Bld: 102 mg/dL — ABNORMAL HIGH (ref 70–99)
HCT: 36 % — ABNORMAL LOW (ref 39.0–52.0)
Hemoglobin: 12.2 g/dL — ABNORMAL LOW (ref 13.0–17.0)
Potassium: 4 mmol/L (ref 3.5–5.1)
Sodium: 138 mmol/L (ref 135–145)
TCO2: 26 mmol/L (ref 22–32)

## 2021-03-11 SURGERY — ESOPHAGOGASTRODUODENOSCOPY (EGD) WITH PROPOFOL
Anesthesia: Monitor Anesthesia Care

## 2021-03-11 MED ORDER — PROPOFOL 10 MG/ML IV BOLUS
INTRAVENOUS | Status: DC | PRN
  Administered 2021-03-11: 20 mg via INTRAVENOUS

## 2021-03-11 MED ORDER — SODIUM CHLORIDE 0.9 % IV SOLN: INTRAVENOUS | Status: DC

## 2021-03-11 MED ORDER — PROPOFOL 10 MG/ML IV BOLUS
INTRAVENOUS | Status: AC
  Filled 2021-03-11: qty 20

## 2021-03-11 MED ORDER — PROPOFOL 500 MG/50ML IV EMUL
INTRAVENOUS | Status: DC | PRN
  Administered 2021-03-11: 100 ug/kg/min via INTRAVENOUS

## 2021-03-11 MED ORDER — LACTATED RINGERS IV SOLN
INTRAVENOUS | Status: AC | PRN
  Administered 2021-03-11: 1000 mL via INTRAVENOUS

## 2021-03-11 SURGICAL SUPPLY — 24 items

## 2021-03-11 NOTE — Anesthesia Preprocedure Evaluation (Signed)
Anesthesia Evaluation  Patient identified by MRN, date of birth, ID band Patient awake    Reviewed: Allergy & Precautions, NPO status , Patient's Chart, lab work & pertinent test results, reviewed documented beta blocker date and time   Airway Mallampati: II  TM Distance: >3 FB Neck ROM: Full    Dental no notable dental hx.    Pulmonary neg pulmonary ROS,    Pulmonary exam normal breath sounds clear to auscultation       Cardiovascular hypertension, Pt. on medications Normal cardiovascular exam Rhythm:Regular Rate:Normal     Neuro/Psych Left hemiplegia with contracture left arm Still with weakness left leg- walks short distances with a cane CVA, Residual Symptoms    GI/Hepatic Neg liver ROS, GERD  Medicated,  Endo/Other  diabetes, Well Controlled, Type 2, Oral Hypoglycemic AgentsObesity Hyperlipidemia  Renal/GU negative Renal ROS  negative genitourinary   Musculoskeletal Contracture left arm   Abdominal   Peds  Hematology  (+) anemia , Fe deficiency anemia   Anesthesia Other Findings   Reproductive/Obstetrics                             Anesthesia Physical Anesthesia Plan  ASA: III  Anesthesia Plan: MAC   Post-op Pain Management:    Induction:   PONV Risk Score and Plan: 1 and Treatment may vary due to age or medical condition and Propofol infusion  Airway Management Planned: Natural Airway and Nasal Cannula  Additional Equipment:   Intra-op Plan:   Post-operative Plan:   Informed Consent: I have reviewed the patients History and Physical, chart, labs and discussed the procedure including the risks, benefits and alternatives for the proposed anesthesia with the patient or authorized representative who has indicated his/her understanding and acceptance.     Dental advisory given  Plan Discussed with: CRNA and Anesthesiologist  Anesthesia Plan Comments:          Anesthesia Quick Evaluation

## 2021-03-11 NOTE — Anesthesia Procedure Notes (Addendum)
Procedure Name: MAC Date/Time: 03/11/2021 11:34 AM Performed by: Cleda Daub, CRNA Pre-anesthesia Checklist: Patient identified, Emergency Drugs available, Suction available, Patient being monitored and Timeout performed Patient Re-evaluated:Patient Re-evaluated prior to induction Oxygen Delivery Method: Simple face mask Dental Injury: Teeth and Oropharynx as per pre-operative assessment

## 2021-03-11 NOTE — Interval H&P Note (Signed)
History and Physical Interval Note:  03/11/2021 11:34 AM  Kent Williams  has presented today for surgery, with the diagnosis of IRON DEFICIENCY ANEMIA.  The various methods of treatment have been discussed with the patient and family. After consideration of risks, benefits and other options for treatment, the patient has consented to  Procedure(s): ESOPHAGOGASTRODUODENOSCOPY (EGD) WITH PROPOFOL (N/A) COLONOSCOPY WITH PROPOFOL (N/A) as a surgical intervention.  The patient's history has been reviewed, patient examined, no change in status, stable for surgery.  I have reviewed the patient's chart and labs.  Questions were answered to the patient's satisfaction.     Charlie Pitter III

## 2021-03-11 NOTE — H&P (Signed)
History:  This patient presents for endoscopic testing for Iron deficiency anemia (see 02/08/2021 GI office consult note for details).  [place] Noe Referring physician: Anson Fret, MD  Past Medical History: Past Medical History:  Diagnosis Date  . Colon polyp   . Diabetes (HCC)   . Hypertension   . Stroke [facility])      Past Surgical History: Past Surgical History:  Procedure Laterality Date  . NO PAST SURGERIES      Allergies: No Known Allergies  Outpatient Meds: Current Facility-Administered Medications  Medication Dose Route Frequency Provider Last Rate Last Admin  . 0.9 %  sodium chloride infusion   Intravenous Continuous Danis, Starr Lake III, MD          ___________________________________________________________________ Objective   Exam:  BP 119/69   Temp 98.6 F (37 C) (Oral)   Resp 12   Ht 6\' 1"  (1.854 m)   SpO2 96%   BMI 34.17 kg/m    CV: RRR without murmur, S1/S2, no JVD, no peripheral edema  Resp: clear to auscultation bilaterally, normal RR and effort noted  GI: soft, no tenderness, with active bowel sounds. No guarding or palpable organomegaly noted.  Neuro:left hemiparesis   Assessment:  IDA  Plan:  EGD and colonoscopy   III

## 2021-03-11 NOTE — Op Note (Signed)
[facility] Patient Name: Kent Williams Procedure Date: 03/11/2021 MRN: [id number] Attending MD: Estill Cotta. Loletha Carrow , MD Date of Birth: 1941/01/15 CSN: [id number] Age: 80 Admit Type: Outpatient Procedure:                Upper GI endoscopy Indications:              Iron deficiency anemia Providers:                Mallie Mussel L. Loletha Carrow, MD, Burtis Junes, RN, Tyna Jaksch                            Technician Referring MD:             [place], [place] Medicines:                Monitored Anesthesia Care Complications:            No immediate complications. Estimated Blood Loss:     Estimated blood loss was minimal. Procedure:                Pre-Anesthesia Assessment:                           - Prior to the procedure, a History and Physical                            was performed, and patient medications and                            allergies were reviewed. The patient's tolerance of                            previous anesthesia was also reviewed. The risks                            and benefits of the procedure and the sedation                            options and risks were discussed with the patient.                            All questions were answered, and informed consent                            was obtained. Prior Anticoagulants: The patient has                            taken no previous anticoagulant or antiplatelet                            agents except for aspirin. ASA Grade Assessment:                            III - A patient with severe systemic disease. After  reviewing the risks and benefits, the patient was                            deemed in satisfactory condition to undergo the                            procedure.                           After obtaining informed consent, the endoscope was                            passed under direct vision. Throughout the                            procedure, the patient's  blood pressure, pulse, and                            oxygen saturations were monitored continuously. The                            [id number]-H190 ([id number]) Olympus gastroscope was                            introduced through the mouth, and advanced to the                            second part of duodenum. The upper GI endoscopy was                            accomplished without difficulty. The patient                            tolerated the procedure fairly well. Scope In: Scope Out: Findings:      The esophagus was normal.      Patchy inflammation characterized by adherent blood and erythema, bile       staining was found in the gastric fundus and in the gastric body.       Biopsies were taken with a cold forceps for histology (antrum and body       to r/o H pylori).      The exam of the stomach was otherwise normal.      The examined duodenum was normal. Impression:               - Normal esophagus.                           - Gastritis. Biopsied.                           - Normal examined duodenum.                           Gastric findings may be from aspirin, bile  gastritis or H pylori. They are likely a minor                            factor in IDA, which appears largely due to chronic                            kidney disease. Moderate Sedation:      MAC sedation used Recommendation:           - Patient has a contact number available for                            emergencies. The signs and symptoms of potential                            delayed complications were discussed with the                            patient. Return to normal activities tomorrow.                            Written discharge instructions were provided to the                            patient.                           - Resume previous diet.                           - Continue present medications.                           - Await pathology results.                            - See the other procedure note for documentation of                            additional recommendations.                           - This patient needs long term iron supplementation                            and close attention to follow up blood work with                            his primary providers at the [place]. Consider periodic                            IV iron since oral iron is worsening this patient's                            chronic constipation. Procedure Code(s):        ---  Professional ---                           [phone], Esophagogastroduodenoscopy, flexible,                            transoral; with biopsy, single or multiple Diagnosis Code(s):        --- Professional ---                           K29.70, Gastritis, unspecified, without bleeding                           D50.9, Iron deficiency anemia, unspecified CPT copyright 2019 American Medical Association. All rights reserved. The codes documented in this report are preliminary and upon coder review may  be revised to meet current compliance requirements. Tessie Ordaz L. Loletha Carrow, MD 03/11/2021 12:19:35 PM This report has been signed electronically. Number of Addenda: 0

## 2021-03-11 NOTE — Op Note (Signed)
[facility] Patient Name: Kent Williams Procedure Date: 03/11/2021 MRN: [id number] Attending MD: Estill Cotta. Loletha Carrow , MD Date of Birth: 03/26/1941 CSN: [id number] Age: 80 Admit Type: Outpatient Procedure:                Colonoscopy Indications:              Iron deficiency anemia Providers:                Mallie Mussel L. Loletha Carrow, MD, Burtis Junes, RN, Tyna Jaksch                            Technician Referring MD:             [place], [place] Medicines:                Monitored Anesthesia Care Complications:            No immediate complications. Estimated Blood Loss:     Estimated blood loss: none. Procedure:                Pre-Anesthesia Assessment:                           - Prior to the procedure, a History and Physical                            was performed, and patient medications and                            allergies were reviewed. The patient's tolerance of                            previous anesthesia was also reviewed. The risks                            and benefits of the procedure and the sedation                            options and risks were discussed with the patient.                            All questions were answered, and informed consent                            was obtained. Prior Anticoagulants: The patient has                            taken no previous anticoagulant or antiplatelet                            agents except for aspirin. ASA Grade Assessment:                            III - A patient with severe systemic disease. After  reviewing the risks and benefits, the patient was                            deemed in satisfactory condition to undergo the                            procedure.                           After obtaining informed consent, the colonoscope                            was passed under direct vision. Throughout the                            procedure, the patient's blood  pressure, pulse, and                            oxygen saturations were monitored continuously. The                            CF-HQ190L ([id number]) Olympus colonoscope was                            introduced through the anus and advanced to the the                            cecum, identified by appendiceal orifice and                            ileocecal valve. The colonoscopy was somewhat                            difficult due to a redundant colon and significant                            looping. Successful completion of the procedure was                            aided by using manual pressure. The patient                            tolerated the procedure well. The quality of the                            bowel preparation was good after lavage. The                            ileocecal valve, appendiceal orifice, and rectum                            were photographed. The bowel preparation used was  GoLYTELY. Scope In: 11:40:02 AM Scope Out: 12:01:31 PM Scope Withdrawal Time: 0 hours 13 minutes 5 seconds  Total Procedure Duration: 0 hours 21 minutes 29 seconds  Findings:      The perianal and digital rectal examinations were normal.      The colon (entire examined portion) was redundant.      Internal hemorrhoids were found.      The exam was otherwise without abnormality on direct and retroflexion       views. Impression:               - Redundant colon.                           - Internal hemorrhoids.                           - The examination was otherwise normal on direct                            and retroflexion views.                           - No specimens collected.                           No source for iron deficiency anemia seen on this                            procedure. Moderate Sedation:      MAC sedation used Recommendation:           - Patient has a contact number available for                            emergencies. The  signs and symptoms of potential                            delayed complications were discussed with the                            patient. Return to normal activities tomorrow.                            Written discharge instructions were provided to the                            patient.                           - Resume previous diet.                           - Continue present medications.                           - No future reoutine screening colonoscopy  recommended due to age.                           - See the other procedure note for documentation of                            additional recommendations. Procedure Code(s):        --- Professional ---                           [phone], Colonoscopy, flexible; diagnostic, including                            collection of specimen(s) by brushing or washing,                            when performed (separate procedure) Diagnosis Code(s):        --- Professional ---                           K64.8, Other hemorrhoids                           D50.9, Iron deficiency anemia, unspecified                           Q43.8, Other specified congenital malformations of                            intestine CPT copyright 2019 American Medical Association. All rights reserved. The codes documented in this report are preliminary and upon coder review may  be revised to meet current compliance requirements. Samy Ryner L. Loletha Carrow, MD 03/11/2021 12:14:21 PM This report has been signed electronically. Number of Addenda: 0

## 2021-03-11 NOTE — Transfer of Care (Signed)
Immediate Anesthesia Transfer of Care Note  Patient: Kent Williams  Procedure(s) Performed: ESOPHAGOGASTRODUODENOSCOPY (EGD) WITH PROPOFOL (N/A ) COLONOSCOPY WITH PROPOFOL (N/A ) BIOPSY  Patient Location: PACU  Anesthesia Type:MAC  Level of Consciousness: Drowsy  Airway & Oxygen Therapy: Patient Spontanous Breathing and Patient connected to face mask oxygen  Post-op Assessment: Report given to RN and Post -op Vital signs reviewed and stable  Post vital signs: Reviewed and stable  Last Vitals:  Vitals Value Taken Time  BP 151/113 03/11/21 1214  Temp    Pulse 92 03/11/21 1216  Resp 19 03/11/21 1216  SpO2 96 % 03/11/21 1216  Vitals shown include unvalidated device data.  Last Pain:  Vitals:   03/11/21 1130  TempSrc: Oral  PainSc: 0-No pain         Complications: No complications documented.

## 2021-03-11 NOTE — Discharge Instructions (Signed)
YOU HAD AN ENDOSCOPIC PROCEDURE TODAY: Refer to the procedure report and other information in the discharge instructions given to you for any specific questions about what was found during the examination. If this information does not answer your questions, please call [place] office at [phone] to clarify.  ° °YOU SHOULD EXPECT: Some feelings of bloating in the abdomen. Passage of more gas than usual. Walking can help get rid of the air that was put into your GI tract during the procedure and reduce the bloating. If you had a lower endoscopy (such as a colonoscopy or flexible sigmoidoscopy) you may notice spotting of blood in your stool or on the toilet paper. Some abdominal soreness may be present for a day or two, also. ° °DIET: Your first meal following the procedure should be a light meal and then it is ok to progress to your normal diet. A half-sandwich or bowl of soup is an example of a good first meal. Heavy or fried foods are harder to digest and may make you feel nauseous or bloated. Drink plenty of fluids but you should avoid alcoholic beverages for 24 hours.  ° °ACTIVITY: Your care partner should take you home directly after the procedure. You should plan to take it easy, moving slowly for the rest of the day. You can resume normal activity the day after the procedure however YOU SHOULD NOT DRIVE, use power tools, machinery or perform tasks that involve climbing or major physical exertion for 24 hours (because of the sedation medicines used during the test).  ° °SYMPTOMS TO REPORT IMMEDIATELY: °A gastroenterologist can be reached at any hour. Please call [phone]  for any of the following symptoms:  °Following lower endoscopy (colonoscopy, flexible sigmoidoscopy) °Excessive amounts of blood in the stool  °Significant tenderness, worsening of abdominal pains  °Swelling of the abdomen that is new, acute  °Fever of 100° or higher  °Following upper endoscopy (EGD, EUS, ERCP, esophageal  dilation) °Vomiting of blood or coffee ground material  °New, significant abdominal pain  °New, significant chest pain or pain under the shoulder blades  °Painful or persistently difficult swallowing  °New shortness of breath  °Black, tarry-looking or red, bloody stools ° °FOLLOW UP:  °If any biopsies were taken you will be contacted by phone or by letter within the next 1-3 weeks. Call [phone]  if you have not heard about the biopsies in 3 weeks.  °Please also call with any specific questions about appointments or follow up tests.  °

## 2021-03-11 NOTE — Anesthesia Postprocedure Evaluation (Signed)
Anesthesia Post Note  Patient: Kent Williams  Procedure(s) Performed: ESOPHAGOGASTRODUODENOSCOPY (EGD) WITH PROPOFOL (N/A ) COLONOSCOPY WITH PROPOFOL (N/A ) BIOPSY     Patient location during evaluation: PACU Anesthesia Type: MAC Level of consciousness: awake and alert and oriented Pain management: pain level controlled Vital Signs Assessment: post-procedure vital signs reviewed and stable Respiratory status: spontaneous breathing, nonlabored ventilation and respiratory function stable Cardiovascular status: stable and blood pressure returned to baseline Postop Assessment: no apparent nausea or vomiting Anesthetic complications: no   No complications documented.  Last Vitals:  Vitals:   03/11/21 1233 03/11/21 1241  BP: 130/77 (!) 125/95  Pulse: 81 81  Resp: (!) 22 18  Temp:    SpO2: 95% 94%    Last Pain:  Vitals:   03/11/21 1241  TempSrc:   PainSc: 0-No pain                 Shiza Thelen A.

## 2021-03-12 ENCOUNTER — Encounter (HOSPITAL_COMMUNITY): Payer: Self-pay | Admitting: Gastroenterology

## 2021-03-12 LAB — SURGICAL PATHOLOGY

## 2021-03-15 ENCOUNTER — Encounter: Payer: Self-pay | Admitting: Gastroenterology

## 2023-02-03 ENCOUNTER — Emergency Department (HOSPITAL_COMMUNITY)
Admission: EM | Admit: 2023-02-03 | Discharge: 2023-02-04 | Disposition: A | Discharge status: Home / Self Care | Payer: No Typology Code available for payment source | Attending: Emergency Medicine | Admitting: Emergency Medicine

## 2023-02-03 ENCOUNTER — Emergency Department (HOSPITAL_COMMUNITY): Payer: No Typology Code available for payment source

## 2023-02-03 ENCOUNTER — Encounter (HOSPITAL_COMMUNITY): Payer: Self-pay

## 2023-02-03 ENCOUNTER — Other Ambulatory Visit: Payer: Self-pay

## 2023-02-03 DIAGNOSIS — Z79899 Other long term (current) drug therapy: Secondary | ICD-10-CM | POA: Diagnosis not present

## 2023-02-03 DIAGNOSIS — Z7984 Long term (current) use of oral hypoglycemic drugs: Secondary | ICD-10-CM | POA: Insufficient documentation

## 2023-02-03 DIAGNOSIS — I10 Essential (primary) hypertension: Secondary | ICD-10-CM | POA: Insufficient documentation

## 2023-02-03 DIAGNOSIS — Z7982 Long term (current) use of aspirin: Secondary | ICD-10-CM | POA: Insufficient documentation

## 2023-02-03 DIAGNOSIS — W010XXA Fall on same level from slipping, tripping and stumbling without subsequent striking against object, initial encounter: Secondary | ICD-10-CM | POA: Diagnosis not present

## 2023-02-03 DIAGNOSIS — E119 Type 2 diabetes mellitus without complications: Secondary | ICD-10-CM | POA: Diagnosis not present

## 2023-02-03 DIAGNOSIS — S42292A Other displaced fracture of upper end of left humerus, initial encounter for closed fracture: Secondary | ICD-10-CM | POA: Diagnosis not present

## 2023-02-03 DIAGNOSIS — S4992XA Unspecified injury of left shoulder and upper arm, initial encounter: Secondary | ICD-10-CM | POA: Diagnosis present

## 2023-02-03 MED ORDER — ACETAMINOPHEN 500 MG PO TABS
1000.0000 mg | ORAL_TABLET | Freq: Once | ORAL | Status: AC
  Administered 2023-02-03: 1000 mg via ORAL
  Filled 2023-02-03: qty 2

## 2023-02-03 MED ORDER — IBUPROFEN 800 MG PO TABS
800.0000 mg | ORAL_TABLET | Freq: Once | ORAL | Status: AC
  Administered 2023-02-03: 800 mg via ORAL
  Filled 2023-02-03: qty 1

## 2023-02-03 MED ORDER — OXYCODONE HCL 5 MG PO TABS: 5.0000 mg | ORAL_TABLET | Freq: Three times a day (TID) | ORAL | 0 refills | Status: AC | PRN

## 2023-02-03 NOTE — Discharge Instructions (Addendum)
You have a fracture of your shoulder.  You have been placed in a sling and I recommend you schedule appointment with orthopedic surgery soon as possible in the next week.  I prescribed you oxycodone 5 mg to take every 8 hours as needed.  You may additionally take ibuprofen 600 mg every 8 hours and Tylenol 1000 mg every 6 hours.

## 2023-02-03 NOTE — ED Notes (Signed)
Patient transported to x-ray. ?

## 2023-02-03 NOTE — ED Provider Notes (Signed)
Supervised resident visit.  Patient here after he slipped while trying to get into an elevator.  He has a history of stroke with left-sided weakness at baseline.  History of hypertension.  He is on any blood thinners.  He is having pain mostly in his left shoulder.  He states that he slipped and was up against the wall and was able to slide down on his back and landed on his left elbow and left shoulder.  Did not hit his head.  Did not lose consciousness.  He is not having any head or neck pain or lower extremity pain.  Will get an x-ray of his left shoulder and left elbow.  He is pretty adamant about not hitting his head.  I talked to him about maybe getting a head CT but he declined.  Ultimately there is no traumatic findings on his head exam.  Is not on blood thinners.  Seems like a low mechanism as well.  Overall he is neurovascular neuromuscular intact.  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  It is possible that he has a fracture but I suspect contusions.  Please see resident note for further results, evaluation, disposition of the patient.  I anticipate discharge.  This chart was dictated using voice recognition software.  Despite best efforts to proofread,  errors can occur which can change the documentation meaning.    Virgina Norfolk, DO 02/03/23 1908

## 2023-02-03 NOTE — ED Triage Notes (Signed)
"  Was trying to get in elevator and slipped and fell, denies hitting head, no loss of consciousness, no obvious injuries, complains of pain in left shoulder pain" per EMS

## 2023-02-03 NOTE — ED Notes (Signed)
Pt returned from X-ray.  

## 2023-02-03 NOTE — ED Provider Notes (Signed)
[place] EMERGENCY DEPARTMENT AT [facility] Provider Note   CSN: [id number] Arrival date & time: 02/03/23  1842  History  Chief Complaint  Patient presents with   Marletta Lor    Kent Williams is a 82 y.o. male.  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  He endorses landing on his left shoulder and elbow and did not make traumatic contact with his head nor lose consciousness.  Endorses the only pain he has is posterior left shoulder and elbow.  Denies chest pain and head/neck pain.  He declined head CT.  At baseline he uses a wheelchair or crutches and is using his crutches during this incident.   Fall   Home Medications Prior to Admission medications   Medication Sig Start Date End Date Taking? Authorizing Provider  oxyCODONE (ROXICODONE) 5 MG immediate release tablet Take 1 tablet (5 mg total) by mouth every 8 (eight) hours as needed for up to 5 days. 02/03/23 02/08/23 Yes Shelby Mattocks, DO  aspirin 81 MG EC tablet Chew 81 mg by mouth daily.    [provider]  chlorthalidone (HYGROTON) 50 MG tablet Take 50 mg by mouth daily.    [provider]  Cholecalciferol (VITAMIN D3) 125 MCG (5000 UT) CAPS Take 5,000 Units by mouth daily.    [provider]  ferrous sulfate 325 (65 FE) MG tablet Take 325 mg by mouth 3 (three) times a week.    [provider]  losartan (COZAAR) 100 MG tablet Take 100 mg by mouth daily.    [provider]  metFORMIN (GLUCOPHAGE) 1000 MG tablet Take 1,000 mg by mouth 2 (two) times daily with a meal.    [provider]  pravastatin (PRAVACHOL) 80 MG tablet Take 80 mg by mouth at bedtime.    [provider]  spironolactone (ALDACTONE) 25 MG tablet Take 25 mg by mouth daily.    [provider]      Allergies    Patient has no known allergies.    Review of  Systems   Review of Systems  All other systems reviewed and are negative.   Physical Exam Updated Vital Signs BP (!) 168/102   Pulse 86   Temp 98 F (36.7 C) (Oral)   Resp (!) 28   Ht 6\' 1"  (1.854 m)   Wt 113.4 kg   SpO2 99%   BMI 32.98 kg/m  Physical Exam Constitutional:      General: He is not in acute distress.    Appearance: Normal appearance.  Cardiovascular:     Rate and Rhythm: Normal rate and regular rhythm.  Pulmonary:     Effort: Pulmonary effort is normal.  Musculoskeletal:        General: Tenderness (Left posterior GH joint, olecranon process) present. No deformity.     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    ED Results / Procedures / Treatments   Labs (all labs ordered are listed, but only abnormal results are displayed) Labs Reviewed - No data to display  EKG None  Radiology DG Elbow Complete Left  Result Date: 02/03/2023 CLINICAL DATA:  Fall EXAM: LEFT ELBOW - COMPLETE 3+ VIEW COMPARISON:  None Available. FINDINGS: No fracture or malalignment.  Possible small  elbow effusion. IMPRESSION: No acute osseous abnormality. Possible small elbow effusion. Electronically Signed   By: Jasmine Pang M.D.   On: 02/03/2023 19:51   DG Shoulder Left  Result Date: 02/03/2023 CLINICAL DATA:  Fall EXAM: LEFT SHOULDER - 2+ VIEW COMPARISON:  None Available. FINDINGS: Acute mildly displaced and impacted fracture involving the left humeral neck and probably the greater tuberosity. No humeral head dislocation. IMPRESSION: Acute mildly displaced and impacted proximal left humerus fracture Electronically Signed   By: Jasmine Pang M.D.   On: 02/03/2023 19:50    Procedures Procedures    Medications Ordered in ED Medications  ibuprofen (ADVIL) tablet 800 mg (800 mg Oral Given 02/03/23 1955)  acetaminophen (TYLENOL) tablet 1,000 mg  (1,000 mg Oral Given 02/03/23 1955)    ED Course/ Medical Decision Making/ A&P                             Medical Decision Making 82 year old male presenting after slip and fall in which she landed on his left shoulder/elbow without further trauma to head or loss of consciousness.  Given lack of trauma to head, patient declined head imaging.  Imaging of shoulder and elbow revealed acute mildly displaced and impacted proximal left humerus fracture.  Rx sent for 5-day supply of oxycodone 5 mg every 8 hours and further advised pain management with Tylenol and ibuprofen.  Information provided for orthopedic surgery to follow-up with next week.  Stable for discharge and placed in shoulder sling in the meantime.         Final Clinical Impression(s) / ED Diagnoses Final diagnoses:  Other closed displaced fracture of proximal end of left humerus, initial encounter    Rx / DC Orders ED Discharge Orders          Ordered    oxyCODONE (ROXICODONE) 5 MG immediate release tablet  Every 8 hours PRN        02/03/23 2010              Shelby Mattocks, DO 02/03/23 2013    Virgina Norfolk, DO 02/10/23 1433

## 2023-02-03 NOTE — ED Notes (Signed)
[facility] called. 4th on the list

## 2024-05-26 ENCOUNTER — Observation Stay (HOSPITAL_COMMUNITY)
Admission: EM | Admit: 2024-05-26 | Discharge: 2024-05-31 | Disposition: A | Discharge status: Home / Self Care | Attending: Internal Medicine | Admitting: Internal Medicine

## 2024-05-26 ENCOUNTER — Encounter (HOSPITAL_COMMUNITY): Payer: Self-pay

## 2024-05-26 ENCOUNTER — Observation Stay (HOSPITAL_COMMUNITY)

## 2024-05-26 ENCOUNTER — Other Ambulatory Visit: Payer: Self-pay

## 2024-05-26 DIAGNOSIS — N179 Acute kidney failure, unspecified: Secondary | ICD-10-CM

## 2024-05-26 DIAGNOSIS — Z7982 Long term (current) use of aspirin: Secondary | ICD-10-CM

## 2024-05-26 DIAGNOSIS — E1142 Type 2 diabetes mellitus with diabetic polyneuropathy: Secondary | ICD-10-CM | POA: Diagnosis not present

## 2024-05-26 DIAGNOSIS — S72001A Fracture of unspecified part of neck of right femur, initial encounter for closed fracture: Secondary | ICD-10-CM | POA: Diagnosis not present

## 2024-05-26 DIAGNOSIS — R630 Anorexia: Secondary | ICD-10-CM | POA: Diagnosis not present

## 2024-05-26 DIAGNOSIS — S4291XA Fracture of right shoulder girdle, part unspecified, initial encounter for closed fracture: Secondary | ICD-10-CM | POA: Insufficient documentation

## 2024-05-26 DIAGNOSIS — R1310 Dysphagia, unspecified: Secondary | ICD-10-CM

## 2024-05-26 DIAGNOSIS — Z7901 Long term (current) use of anticoagulants: Secondary | ICD-10-CM

## 2024-05-26 DIAGNOSIS — Z7984 Long term (current) use of oral hypoglycemic drugs: Secondary | ICD-10-CM

## 2024-05-26 DIAGNOSIS — R4182 Altered mental status, unspecified: Secondary | ICD-10-CM | POA: Insufficient documentation

## 2024-05-26 DIAGNOSIS — E785 Hyperlipidemia, unspecified: Secondary | ICD-10-CM | POA: Diagnosis not present

## 2024-05-26 DIAGNOSIS — E11649 Type 2 diabetes mellitus with hypoglycemia without coma: Secondary | ICD-10-CM | POA: Diagnosis not present

## 2024-05-26 DIAGNOSIS — N1831 Chronic kidney disease, stage 3a: Secondary | ICD-10-CM

## 2024-05-26 DIAGNOSIS — Z79899 Other long term (current) drug therapy: Secondary | ICD-10-CM | POA: Diagnosis not present

## 2024-05-26 DIAGNOSIS — Z8546 Personal history of malignant neoplasm of prostate: Secondary | ICD-10-CM | POA: Diagnosis not present

## 2024-05-26 DIAGNOSIS — Z794 Long term (current) use of insulin: Secondary | ICD-10-CM | POA: Insufficient documentation

## 2024-05-26 DIAGNOSIS — D631 Anemia in chronic kidney disease: Secondary | ICD-10-CM

## 2024-05-26 DIAGNOSIS — D649 Anemia, unspecified: Secondary | ICD-10-CM | POA: Insufficient documentation

## 2024-05-26 DIAGNOSIS — R1311 Dysphagia, oral phase: Secondary | ICD-10-CM

## 2024-05-26 DIAGNOSIS — Z923 Personal history of irradiation: Secondary | ICD-10-CM | POA: Diagnosis not present

## 2024-05-26 DIAGNOSIS — E162 Hypoglycemia, unspecified: Principal | ICD-10-CM

## 2024-05-26 DIAGNOSIS — E1122 Type 2 diabetes mellitus with diabetic chronic kidney disease: Secondary | ICD-10-CM | POA: Diagnosis not present

## 2024-05-26 DIAGNOSIS — C61 Malignant neoplasm of prostate: Secondary | ICD-10-CM | POA: Insufficient documentation

## 2024-05-26 DIAGNOSIS — I69354 Hemiplegia and hemiparesis following cerebral infarction affecting left non-dominant side: Secondary | ICD-10-CM

## 2024-05-26 DIAGNOSIS — Z8673 Personal history of transient ischemic attack (TIA), and cerebral infarction without residual deficits: Secondary | ICD-10-CM | POA: Diagnosis not present

## 2024-05-26 DIAGNOSIS — E875 Hyperkalemia: Secondary | ICD-10-CM

## 2024-05-26 DIAGNOSIS — I129 Hypertensive chronic kidney disease with stage 1 through stage 4 chronic kidney disease, or unspecified chronic kidney disease: Secondary | ICD-10-CM | POA: Diagnosis not present

## 2024-05-26 LAB — CBG MONITORING, ED
Glucose-Capillary: 65 mg/dL — ABNORMAL LOW (ref 70–99)
Glucose-Capillary: 110 mg/dL — ABNORMAL HIGH (ref 70–99)
Glucose-Capillary: 48 mg/dL — ABNORMAL LOW (ref 70–99)
Glucose-Capillary: 32 mg/dL — CL (ref 70–99)

## 2024-05-26 LAB — HEPATIC FUNCTION PANEL
ALT: 16 U/L (ref 0–44)
AST: 31 U/L (ref 15–41)
Albumin: 2.9 g/dL — ABNORMAL LOW (ref 3.5–5.0)
Alkaline Phosphatase: 107 U/L (ref 38–126)
Bilirubin, Direct: 0.2 mg/dL (ref 0.0–0.2)
Indirect Bilirubin: 0.4 mg/dL (ref 0.3–0.9)
Total Bilirubin: 0.6 mg/dL (ref 0.0–1.2)
Total Protein: 7.3 g/dL (ref 6.5–8.1)

## 2024-05-26 LAB — URINALYSIS, ROUTINE W REFLEX MICROSCOPIC
Bilirubin Urine: NEGATIVE
Glucose, UA: NEGATIVE mg/dL
Ketones, ur: NEGATIVE mg/dL
Leukocytes,Ua: NEGATIVE
Nitrite: NEGATIVE
Protein, ur: NEGATIVE mg/dL
Specific Gravity, Urine: 1.015 (ref 1.005–1.030)
pH: 5 (ref 5.0–8.0)

## 2024-05-26 LAB — CBC
HCT: 31.9 % — ABNORMAL LOW (ref 39.0–52.0)
Hemoglobin: 9.8 g/dL — ABNORMAL LOW (ref 13.0–17.0)
MCH: 28.9 pg (ref 26.0–34.0)
MCHC: 30.7 g/dL (ref 30.0–36.0)
MCV: 94.1 fL (ref 80.0–100.0)
Platelets: 288 K/uL (ref 150–400)
RBC: 3.39 MIL/uL — ABNORMAL LOW (ref 4.22–5.81)
RDW: 13.8 % (ref 11.5–15.5)
WBC: 6.3 K/uL (ref 4.0–10.5)
nRBC: 0 % (ref 0.0–0.2)

## 2024-05-26 LAB — GLUCOSE, CAPILLARY
Glucose-Capillary: 116 mg/dL — ABNORMAL HIGH (ref 70–99)
Glucose-Capillary: 123 mg/dL — ABNORMAL HIGH (ref 70–99)
Glucose-Capillary: 58 mg/dL — ABNORMAL LOW (ref 70–99)
Glucose-Capillary: 83 mg/dL (ref 70–99)
Glucose-Capillary: 86 mg/dL (ref 70–99)

## 2024-05-26 LAB — AMMONIA: Ammonia: 17 umol/L (ref 9–35)

## 2024-05-26 LAB — BASIC METABOLIC PANEL WITH GFR
Anion gap: 13 (ref 5–15)
BUN: 60 mg/dL — ABNORMAL HIGH (ref 8–23)
CO2: 21 mmol/L — ABNORMAL LOW (ref 22–32)
Calcium: 9.5 mg/dL (ref 8.9–10.3)
Chloride: 106 mmol/L (ref 98–111)
Creatinine, Ser: 2.43 mg/dL — ABNORMAL HIGH (ref 0.61–1.24)
GFR, Estimated: 26 mL/min — ABNORMAL LOW (ref >60)
Glucose, Bld: 127 mg/dL — ABNORMAL HIGH (ref 70–99)
Potassium: 5.2 mmol/L — ABNORMAL HIGH (ref 3.5–5.1)
Sodium: 140 mmol/L (ref 135–145)

## 2024-05-26 LAB — LACTIC ACID, PLASMA: Lactic Acid, Venous: 1.5 mmol/L (ref 0.5–1.9)

## 2024-05-26 MED ORDER — ACETAMINOPHEN 650 MG RE SUPP
650.0000 mg | Freq: Four times a day (QID) | RECTAL | Status: DC | PRN
  Filled 2024-05-26: qty 1

## 2024-05-26 MED ORDER — ACETAMINOPHEN 325 MG PO TABS
650.0000 mg | ORAL_TABLET | Freq: Four times a day (QID) | ORAL | Status: DC | PRN
  Administered 2024-05-27 – 2024-05-28 (×3): 650 mg via ORAL
  Filled 2024-05-26 (×4): qty 2

## 2024-05-26 MED ORDER — RIVAROXABAN 10 MG PO TABS: 10.0000 mg | ORAL_TABLET | Freq: Every day | ORAL | Status: DC

## 2024-05-26 MED ORDER — DEXTROSE 50 % IV SOLN: 50.0000 mL | Freq: Once | INTRAVENOUS | Status: AC

## 2024-05-26 MED ORDER — DEXTROSE 50 % IV SOLN
INTRAVENOUS | Status: AC
  Administered 2024-05-26: 50 mL via INTRAVENOUS
  Filled 2024-05-26: qty 50

## 2024-05-26 MED ORDER — DEXTROSE 50 % IV SOLN
INTRAVENOUS | Status: AC
  Filled 2024-05-26: qty 50

## 2024-05-26 MED ORDER — LIDOCAINE 5 % EX PTCH
1.0000 | MEDICATED_PATCH | CUTANEOUS | Status: DC
  Administered 2024-05-27 – 2024-05-30 (×4): 1 via TRANSDERMAL
  Filled 2024-05-26 (×5): qty 1

## 2024-05-26 MED ORDER — DEXTROSE IN LACTATED RINGERS 5 % IV SOLN: INTRAVENOUS | Status: AC

## 2024-05-26 MED ORDER — DEXTROSE 50 % IV SOLN
25.0000 mL | Freq: Once | INTRAVENOUS | Status: AC
  Administered 2024-05-26: 25 mL via INTRAVENOUS

## 2024-05-26 MED ORDER — DEXTROSE 50 % IV SOLN
INTRAVENOUS | Status: AC
  Administered 2024-05-26: 50 mL
  Filled 2024-05-26: qty 50

## 2024-05-26 MED ORDER — LACTATED RINGERS IV BOLUS
500.0000 mL | Freq: Once | INTRAVENOUS | Status: AC
  Administered 2024-05-26: 500 mL via INTRAVENOUS

## 2024-05-26 MED ORDER — ENOXAPARIN SODIUM 40 MG/0.4ML IJ SOSY
40.0000 mg | PREFILLED_SYRINGE | INTRAMUSCULAR | Status: DC
  Administered 2024-05-26 – 2024-05-30 (×5): 40 mg via SUBCUTANEOUS
  Filled 2024-05-26 (×5): qty 0.4

## 2024-05-26 NOTE — Progress Notes (Signed)
 This RN used a warm heating pad to the fingers prior to cbg. Reading went from 86 to 83 to 116 after the warm compress to the finger. Pt education to alert NT and RN to warm his finger prior to cbgs.
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
## 2024-05-26 NOTE — ED Triage Notes (Addendum)
 Pt bib [facility] from [facility] for AMS/ unresponsive. When EMS arrived pt was unresponsive with snoring respirations. Initial CBG was 49, EMS unable to obtain IV access so 1mg  glucagon Im administered in R deltoid. Pt is now a/o x4, last CBG with EMS was 69.  Staff reports at 6am his CBG was 160 and he was A/Ox4  Pt has hx of stroke with left sided deficits  EMS also reports staff mentioned the pt has had a steady decline in his health over the past 2 days but unable to elaborate. Staff states they dont know the pt that well. No paperwork sent with the pt due to the printer not working

## 2024-05-26 NOTE — ED Notes (Signed)
 Patient given peanut butter and OJ. Patient had quite a bit of difficulty swallowing both, but no indication of aspiration noted.

## 2024-05-26 NOTE — ED Notes (Addendum)
 RN should be aware Per Doyal Miyamoto MD - Also, could we check the next CBG from his ear lobe? Just wanting to make sure his hypoglycemia is not clouded by peripheral vascular disease.

## 2024-05-26 NOTE — ED Notes (Signed)
 Courtsey call given to [id number].

## 2024-05-27 DIAGNOSIS — R1310 Dysphagia, unspecified: Secondary | ICD-10-CM | POA: Diagnosis not present

## 2024-05-27 DIAGNOSIS — E162 Hypoglycemia, unspecified: Secondary | ICD-10-CM | POA: Diagnosis not present

## 2024-05-27 DIAGNOSIS — R630 Anorexia: Secondary | ICD-10-CM | POA: Diagnosis not present

## 2024-05-27 DIAGNOSIS — N179 Acute kidney failure, unspecified: Secondary | ICD-10-CM | POA: Diagnosis not present

## 2024-05-27 LAB — CBC
HCT: 30 % — ABNORMAL LOW (ref 39.0–52.0)
Hemoglobin: 9.7 g/dL — ABNORMAL LOW (ref 13.0–17.0)
MCH: 29.5 pg (ref 26.0–34.0)
MCHC: 32.3 g/dL (ref 30.0–36.0)
MCV: 91.2 fL (ref 80.0–100.0)
Platelets: 276 K/uL (ref 150–400)
RBC: 3.29 MIL/uL — ABNORMAL LOW (ref 4.22–5.81)
RDW: 13.7 % (ref 11.5–15.5)
WBC: 6.4 K/uL (ref 4.0–10.5)
nRBC: 0 % (ref 0.0–0.2)

## 2024-05-27 LAB — BASIC METABOLIC PANEL WITH GFR
Anion gap: 7 (ref 5–15)
BUN: 46 mg/dL — ABNORMAL HIGH (ref 8–23)
CO2: 23 mmol/L (ref 22–32)
Calcium: 9.2 mg/dL (ref 8.9–10.3)
Chloride: 109 mmol/L (ref 98–111)
Creatinine, Ser: 1.99 mg/dL — ABNORMAL HIGH (ref 0.61–1.24)
GFR, Estimated: 33 mL/min — ABNORMAL LOW (ref >60)
Glucose, Bld: 113 mg/dL — ABNORMAL HIGH (ref 70–99)
Potassium: 4.3 mmol/L (ref 3.5–5.1)
Sodium: 139 mmol/L (ref 135–145)

## 2024-05-27 LAB — GLUCOSE, CAPILLARY
Glucose-Capillary: 42 mg/dL — CL (ref 70–99)
Glucose-Capillary: 124 mg/dL — ABNORMAL HIGH (ref 70–99)
Glucose-Capillary: 107 mg/dL — ABNORMAL HIGH (ref 70–99)
Glucose-Capillary: 122 mg/dL — ABNORMAL HIGH (ref 70–99)
Glucose-Capillary: 107 mg/dL — ABNORMAL HIGH (ref 70–99)
Glucose-Capillary: 99 mg/dL (ref 70–99)
Glucose-Capillary: 171 mg/dL — ABNORMAL HIGH (ref 70–99)
Glucose-Capillary: 207 mg/dL — ABNORMAL HIGH (ref 70–99)
Glucose-Capillary: 176 mg/dL — ABNORMAL HIGH (ref 70–99)

## 2024-05-27 MED ORDER — PRAVASTATIN SODIUM 40 MG PO TABS
80.0000 mg | ORAL_TABLET | Freq: Every day | ORAL | Status: DC
  Administered 2024-05-27 – 2024-05-30 (×4): 80 mg via ORAL
  Filled 2024-05-27 (×4): qty 2

## 2024-05-27 MED ORDER — ASPIRIN 81 MG PO TBEC
81.0000 mg | DELAYED_RELEASE_TABLET | Freq: Every day | ORAL | Status: DC
  Administered 2024-05-27 – 2024-05-31 (×5): 81 mg via ORAL
  Filled 2024-05-27 (×5): qty 1

## 2024-05-27 NOTE — Plan of Care (Signed)

## 2024-05-27 NOTE — Progress Notes (Signed)
 IM notified that cardiac monitoring had expired.
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
## 2024-05-28 DIAGNOSIS — N179 Acute kidney failure, unspecified: Secondary | ICD-10-CM

## 2024-05-28 DIAGNOSIS — R630 Anorexia: Secondary | ICD-10-CM | POA: Diagnosis not present

## 2024-05-28 DIAGNOSIS — R1310 Dysphagia, unspecified: Secondary | ICD-10-CM | POA: Diagnosis not present

## 2024-05-28 DIAGNOSIS — E162 Hypoglycemia, unspecified: Secondary | ICD-10-CM | POA: Diagnosis not present

## 2024-05-28 LAB — C-PEPTIDE: C-Peptide: 1.9 ng/mL (ref 1.1–4.4)

## 2024-05-28 LAB — CBC
HCT: 29.3 % — ABNORMAL LOW (ref 39.0–52.0)
Hemoglobin: 9.4 g/dL — ABNORMAL LOW (ref 13.0–17.0)
MCH: 29.3 pg (ref 26.0–34.0)
MCHC: 32.1 g/dL (ref 30.0–36.0)
MCV: 91.3 fL (ref 80.0–100.0)
Platelets: 261 K/uL (ref 150–400)
RBC: 3.21 MIL/uL — ABNORMAL LOW (ref 4.22–5.81)
RDW: 13.7 % (ref 11.5–15.5)
WBC: 6.2 K/uL (ref 4.0–10.5)
nRBC: 0 % (ref 0.0–0.2)

## 2024-05-28 LAB — IRON AND TIBC
Iron: 25 ug/dL — ABNORMAL LOW (ref 45–182)
Saturation Ratios: 10 % — ABNORMAL LOW (ref 17.9–39.5)
TIBC: 252 ug/dL (ref 250–450)
UIBC: 227 ug/dL

## 2024-05-28 LAB — GLUCOSE, CAPILLARY
Glucose-Capillary: 134 mg/dL — ABNORMAL HIGH (ref 70–99)
Glucose-Capillary: 147 mg/dL — ABNORMAL HIGH (ref 70–99)
Glucose-Capillary: 341 mg/dL — ABNORMAL HIGH (ref 70–99)
Glucose-Capillary: 176 mg/dL — ABNORMAL HIGH (ref 70–99)
Glucose-Capillary: 155 mg/dL — ABNORMAL HIGH (ref 70–99)

## 2024-05-28 LAB — BASIC METABOLIC PANEL WITH GFR
Anion gap: 7 (ref 5–15)
BUN: 39 mg/dL — ABNORMAL HIGH (ref 8–23)
CO2: 24 mmol/L (ref 22–32)
Calcium: 9.1 mg/dL (ref 8.9–10.3)
Chloride: 110 mmol/L (ref 98–111)
Creatinine, Ser: 1.92 mg/dL — ABNORMAL HIGH (ref 0.61–1.24)
GFR, Estimated: 35 mL/min — ABNORMAL LOW (ref >60)
Glucose, Bld: 139 mg/dL — ABNORMAL HIGH (ref 70–99)
Potassium: 4.2 mmol/L (ref 3.5–5.1)
Sodium: 141 mmol/L (ref 135–145)

## 2024-05-28 LAB — INSULIN, RANDOM: Insulin: 6.2 u[IU]/mL (ref 2.6–24.9)

## 2024-05-28 LAB — RETICULOCYTES
Immature Retic Fract: 15.1 % (ref 2.3–15.9)
RBC.: 3.22 MIL/uL — ABNORMAL LOW (ref 4.22–5.81)
Retic Count, Absolute: 43.1 K/uL (ref 19.0–186.0)
Retic Ct Pct: 1.3 % (ref 0.4–3.1)

## 2024-05-28 LAB — FERRITIN: Ferritin: 113 ng/mL (ref 24–336)

## 2024-05-28 MED ORDER — ENSURE PLUS HIGH PROTEIN PO LIQD
237.0000 mL | Freq: Two times a day (BID) | ORAL | Status: DC
  Administered 2024-05-28 – 2024-05-31 (×6): 237 mL via ORAL

## 2024-05-28 MED ORDER — INSULIN ASPART 100 UNIT/ML IJ SOLN
0.0000 [IU] | Freq: Three times a day (TID) | INTRAMUSCULAR | Status: DC
  Administered 2024-05-29: 5 [IU] via SUBCUTANEOUS
  Administered 2024-05-30 – 2024-05-31 (×2): 3 [IU] via SUBCUTANEOUS

## 2024-05-28 NOTE — Inpatient Diabetes Management (Signed)
 Inpatient Diabetes Program Recommendations  AACE/ADA: New Consensus Statement on Inpatient Glycemic Control (2015)  Target Ranges:  Prepandial:   less than 140 mg/dL      Peak postprandial:   less than 180 mg/dL (1-2 hours)      Critically ill patients:  140 - 180 mg/dL   Lab Results  Component Value Date   GLUCAP 341 (H) 05/28/2024   HGBA1C 8.3 (H) 12/10/2009    Review of Glycemic Control  Latest Reference Range & Units 05/28/24 07:35 05/28/24 11:27  Glucose-Capillary 70 - 99 mg/dL 852 (H) 658 (H)  (H): Data is abnormally high  Diabetes history: DM2 Outpatient Diabetes medications: Basaglar 30 units every day, Metformin 1 gm bid Current orders for Inpatient glycemic control: None  Inpatient Diabetes Program Recommendations:    Might consider:  Novolog  0-9 units TID and 0-5 units at bedtime  Thank you, Wyvonna Pinal, MSN, CDCES Diabetes Coordinator Inpatient Diabetes Program [phone] (team pager from 8a-5p)
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
## 2024-05-28 NOTE — Plan of Care (Signed)
  Problem: Nutrition: Goal: Adequate nutrition will be maintained Outcome: Progressing   Problem: Coping: Goal: Level of anxiety will decrease 05/28/2024 0318 by Delores Lucienne RAMAN, LPN Outcome: Progressing 05/28/2024 0315 by Delores Lucienne RAMAN, LPN Outcome: Progressing   Problem: Coping: Goal: Level of anxiety will decrease 05/28/2024 0318 by Delores Lucienne RAMAN, LPN Outcome: Progressing 05/28/2024 0315 by Delores Lucienne RAMAN, LPN Outcome: Progressing   Problem: Elimination: Goal: Will not experience complications related to bowel motility Outcome: Progressing Goal: Will not experience complications related to urinary retention Outcome: Progressing   Problem: Pain Managment: Goal: General experience of comfort will improve and/or be controlled Outcome: Progressing   Problem: Safety: Goal: Ability to remain free from injury will improve Outcome: Progressing   Problem: Skin Integrity: Goal: Risk for impaired skin integrity will decrease Outcome: Progressing   Problem: Safety: Goal: Ability to remain free from injury will improve Outcome: Progressing

## 2024-05-28 NOTE — Plan of Care (Incomplete)
   Problem: Health Behavior/Discharge Planning: Goal: Ability to manage health-related needs will improve Outcome: Progressing

## 2024-05-29 ENCOUNTER — Observation Stay (HOSPITAL_COMMUNITY)

## 2024-05-29 DIAGNOSIS — R630 Anorexia: Secondary | ICD-10-CM | POA: Diagnosis not present

## 2024-05-29 DIAGNOSIS — E162 Hypoglycemia, unspecified: Secondary | ICD-10-CM | POA: Diagnosis not present

## 2024-05-29 DIAGNOSIS — N179 Acute kidney failure, unspecified: Secondary | ICD-10-CM | POA: Diagnosis not present

## 2024-05-29 DIAGNOSIS — R1310 Dysphagia, unspecified: Secondary | ICD-10-CM | POA: Diagnosis not present

## 2024-05-29 LAB — GLUCOSE, CAPILLARY
Glucose-Capillary: 115 mg/dL — ABNORMAL HIGH (ref 70–99)
Glucose-Capillary: 229 mg/dL — ABNORMAL HIGH (ref 70–99)
Glucose-Capillary: 70 mg/dL (ref 70–99)
Glucose-Capillary: 170 mg/dL — ABNORMAL HIGH (ref 70–99)

## 2024-05-29 LAB — HEMOGLOBIN A1C
Hgb A1c MFr Bld: 7 % — ABNORMAL HIGH (ref 4.8–5.6)
Mean Plasma Glucose: 154.2 mg/dL

## 2024-05-29 LAB — CBC
HCT: 28.5 % — ABNORMAL LOW (ref 39.0–52.0)
Hemoglobin: 9.2 g/dL — ABNORMAL LOW (ref 13.0–17.0)
MCH: 29.4 pg (ref 26.0–34.0)
MCHC: 32.3 g/dL (ref 30.0–36.0)
MCV: 91.1 fL (ref 80.0–100.0)
Platelets: 248 K/uL (ref 150–400)
RBC: 3.13 MIL/uL — ABNORMAL LOW (ref 4.22–5.81)
RDW: 13.5 % (ref 11.5–15.5)
WBC: 6.6 K/uL (ref 4.0–10.5)
nRBC: 0 % (ref 0.0–0.2)

## 2024-05-29 LAB — BASIC METABOLIC PANEL WITH GFR
Anion gap: 11 (ref 5–15)
BUN: 34 mg/dL — ABNORMAL HIGH (ref 8–23)
CO2: 23 mmol/L (ref 22–32)
Calcium: 8.8 mg/dL — ABNORMAL LOW (ref 8.9–10.3)
Chloride: 104 mmol/L (ref 98–111)
Creatinine, Ser: 1.62 mg/dL — ABNORMAL HIGH (ref 0.61–1.24)
GFR, Estimated: 42 mL/min — ABNORMAL LOW (ref >60)
Glucose, Bld: 134 mg/dL — ABNORMAL HIGH (ref 70–99)
Potassium: 3.7 mmol/L (ref 3.5–5.1)
Sodium: 138 mmol/L (ref 135–145)

## 2024-05-29 MED ORDER — DICLOFENAC SODIUM 1 % EX GEL
4.0000 g | Freq: Four times a day (QID) | CUTANEOUS | Status: DC
  Administered 2024-05-29 – 2024-05-31 (×9): 4 g via TOPICAL
  Filled 2024-05-29: qty 100

## 2024-05-29 MED ORDER — ACETAMINOPHEN 500 MG PO TABS: 1000.0000 mg | ORAL_TABLET | Freq: Four times a day (QID) | ORAL | Status: DC | PRN

## 2024-05-29 MED ORDER — POLYETHYLENE GLYCOL 3350 17 G PO PACK
17.0000 g | PACK | Freq: Every day | ORAL | Status: DC
  Administered 2024-05-29 – 2024-05-30 (×2): 17 g via ORAL
  Filled 2024-05-29 (×2): qty 1

## 2024-05-29 MED ORDER — SENNOSIDES-DOCUSATE SODIUM 8.6-50 MG PO TABS
1.0000 | ORAL_TABLET | Freq: Once | ORAL | Status: AC
  Administered 2024-05-29: 1 via ORAL
  Filled 2024-05-29: qty 1

## 2024-05-29 MED ORDER — ACETAMINOPHEN 500 MG PO TABS
1000.0000 mg | ORAL_TABLET | Freq: Three times a day (TID) | ORAL | Status: DC | PRN
  Administered 2024-05-29 – 2024-05-31 (×4): 1000 mg via ORAL
  Filled 2024-05-29 (×4): qty 2

## 2024-05-29 MED ORDER — MORPHINE SULFATE ER 15 MG PO TBCR
15.0000 mg | EXTENDED_RELEASE_TABLET | Freq: Two times a day (BID) | ORAL | Status: DC
  Administered 2024-05-29 – 2024-05-31 (×5): 15 mg via ORAL
  Filled 2024-05-29 (×5): qty 1

## 2024-05-29 NOTE — Plan of Care (Signed)

## 2024-05-29 NOTE — Discharge Instructions (Addendum)
 To [name] or their caretakers,  They were admitted to [facility] on 05/26/2024 for evaluation and treatment of: altered mental status      The evaluation suggested hypoglycemia. They were treated with IV fluids and close monitoring. Eating and drinking were encouraged.   They were discharged from the hospital on 05/29/24. I recommend the following after leaving the hospital:   Medications Adjusted During Hospital Admission:    1) DO NOT TAKE Metformin  2) DO NOT TAKE Chlorthalidone  3) DO NOT TAKE Spironolactone   Home Medications Continued During Hospital Admission without Changes:  1) Continue Aspirin  81 mg: take one tablet by mouth daily   2) Continue pravastatin  (PRAVACHOL ) tablet 80 mg: take one tablet by mouth daily   3) Continue Lidocaine  patches for pain     For questions about your care plan, until you are able to see your primary doctor: Call [phone]. Dial 0 for the operator. Ask for the internal medicine resident on call.  Thank you for allowing us  to be part of your care. We are so glad that you are feeling better.   Kent Chars, MD 05/29/2024, 11:56 AM

## 2024-05-29 NOTE — Telephone Encounter (Signed)
 Call received in triage using 2 patient identifiers from Dr. Jolaine with [facility].   Dr. Jolaine states the patient was admitted to [facility] which is why he missed his appointment today with Alan Merritts.   Dr. Jolaine is wondering about the plan for patients right closed comminuted acetabulum fracture. Advised of note in chart from ED from 04/27/24 - R closed, comminuted acetabulum fracture.  - Neurovascularly as above exam - Anticipate operative fixation  - After informed discussion regarding risks/benefits/alternatives of operative and non operative management, the patient gave informed consent to proceed with operative intervention. The signed consent form was placed in the patient's chart.  - Operative side marked - NWB - Plan for OR Mon (7/21) or Tues (7/22)  - Definitive management pending orthopaedic trauma attendings   Dr. Jolaine would like to discuss plan with provider at [phone].

## 2024-05-29 NOTE — TOC Progression Note (Addendum)
 Transition of Care [facility]) - Progression Note    Patient Details  Name: Kent Williams MRN: [id number] Date of Birth: 25-Jan-1941  Transition of Care [facility]) CM/SW Contact  Bridget Cordella Simmonds, LCSW Phone Number: 05/29/2024, 10:06 AM  Clinical Narrative:   SNF auth request remains pending in [place].   1425: TC Nav[facility]: offering P2P by 930am tomorrow, 8/21.  [id number] option 5.  MD notified.   1605: TC Navi: SNF shara has been denied due to medical instability.  Pain issues and a pending ortho consult were specifically mentioned.  We can resubmit auth once pt is stable.  P2P was completed.  Fast appeal: [phone].  MD notified.   Expected Discharge Plan: Skilled Nursing Facility Barriers to Discharge: Insurance Authorization               Expected Discharge Plan and Services In-house Referral: Clinical Social Work   Post Acute Care Choice: Skilled Nursing Facility Living arrangements for the past 2 months: Apartment                                       Social Drivers of Health (SDOH) Interventions SDOH Screenings   Food Insecurity: No Food Insecurity (05/27/2024)  Housing: Low Risk  (05/27/2024)  Transportation Needs: No Transportation Needs (05/27/2024)  Utilities: Not At Risk (05/27/2024)  Social Connections: Socially Isolated (05/27/2024)  Tobacco Use: Low Risk  (05/26/2024)    Readmission Risk Interventions     No data to display
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
## 2024-05-30 DIAGNOSIS — R630 Anorexia: Secondary | ICD-10-CM | POA: Diagnosis not present

## 2024-05-30 DIAGNOSIS — R1310 Dysphagia, unspecified: Secondary | ICD-10-CM | POA: Diagnosis not present

## 2024-05-30 DIAGNOSIS — E162 Hypoglycemia, unspecified: Secondary | ICD-10-CM | POA: Diagnosis not present

## 2024-05-30 DIAGNOSIS — N179 Acute kidney failure, unspecified: Secondary | ICD-10-CM | POA: Diagnosis not present

## 2024-05-30 LAB — CBC
HCT: 27.7 % — ABNORMAL LOW (ref 39.0–52.0)
Hemoglobin: 9 g/dL — ABNORMAL LOW (ref 13.0–17.0)
MCH: 29.6 pg (ref 26.0–34.0)
MCHC: 32.5 g/dL (ref 30.0–36.0)
MCV: 91.1 fL (ref 80.0–100.0)
Platelets: 254 K/uL (ref 150–400)
RBC: 3.04 MIL/uL — ABNORMAL LOW (ref 4.22–5.81)
RDW: 13.4 % (ref 11.5–15.5)
WBC: 5.9 K/uL (ref 4.0–10.5)
nRBC: 0 % (ref 0.0–0.2)

## 2024-05-30 LAB — BASIC METABOLIC PANEL WITH GFR
Anion gap: 10 (ref 5–15)
BUN: 33 mg/dL — ABNORMAL HIGH (ref 8–23)
CO2: 24 mmol/L (ref 22–32)
Calcium: 9.1 mg/dL (ref 8.9–10.3)
Chloride: 106 mmol/L (ref 98–111)
Creatinine, Ser: 1.77 mg/dL — ABNORMAL HIGH (ref 0.61–1.24)
GFR, Estimated: 38 mL/min — ABNORMAL LOW (ref >60)
Glucose, Bld: 129 mg/dL — ABNORMAL HIGH (ref 70–99)
Potassium: 4 mmol/L (ref 3.5–5.1)
Sodium: 140 mmol/L (ref 135–145)

## 2024-05-30 LAB — GLUCOSE, CAPILLARY
Glucose-Capillary: 118 mg/dL — ABNORMAL HIGH (ref 70–99)
Glucose-Capillary: 168 mg/dL — ABNORMAL HIGH (ref 70–99)
Glucose-Capillary: 160 mg/dL — ABNORMAL HIGH (ref 70–99)
Glucose-Capillary: 149 mg/dL — ABNORMAL HIGH (ref 70–99)

## 2024-05-30 MED ORDER — POLYETHYLENE GLYCOL 3350 17 G PO PACK
17.0000 g | PACK | Freq: Two times a day (BID) | ORAL | Status: DC
  Administered 2024-05-30 – 2024-05-31 (×2): 17 g via ORAL
  Filled 2024-05-30 (×2): qty 1

## 2024-05-30 MED ORDER — SENNOSIDES-DOCUSATE SODIUM 8.6-50 MG PO TABS
1.0000 | ORAL_TABLET | Freq: Two times a day (BID) | ORAL | Status: DC
  Administered 2024-05-30 – 2024-05-31 (×3): 1 via ORAL
  Filled 2024-05-30 (×3): qty 1

## 2024-05-30 NOTE — Progress Notes (Signed)
 Physical Therapy Treatment Patient Details Name: Kent Williams MRN: [id number] DOB: May 29, 1941 Today's Date: 05/30/2024   History of Present Illness 83 y.o. male presents to [facility] hospital on 05/26/2024 with AMS, admitted for recurrent hypoglycemia. PMH includes DMII, CVA, HTN, HLD, CKDIII, BPH, prostate CA.    PT Comments  RN requested PT's assistance with returning pt to bed. He was c/o of worsening pain after sitting up in recliner chair for a little under 2 hours. Pt performed chair>bed lateral scoot transfer using sliding board. He demonstrated improved activation pushing through LLE and RUE in order to clear hips and scoot to the right. Pt required moderate cues for sequencing and modA x2. Will continue to follow acutely and advance appropriately.    If plan is discharge home, recommend the following: Two people to help with walking and/or transfers;Two people to help with bathing/dressing/bathroom;Assistance with cooking/housework;Assist for transportation;Help with stairs or ramp for entrance   Can travel by private vehicle     No  Equipment Recommendations  Hospital bed;Hoyer lift;Other (comment) (sliding board)    Recommendations for Other Services       Precautions / Restrictions Precautions Precautions: Fall Recall of Precautions/Restrictions: Intact Restrictions Weight Bearing Restrictions Per Provider Order: Yes RUE Weight Bearing Per Provider Order: Weight bearing as tolerated RLE Weight Bearing Per Provider Order: Non weight bearing     Mobility  Bed Mobility Overal bed mobility: Needs Assistance Bed Mobility: Sit to Supine     Supine to sit: HOB elevated, Used rails, Mod assist Sit to supine: Max assist   General bed mobility comments: Assist to bring BLE back into bed. Pt controlled trunk down.    Transfers Overall transfer level: Needs assistance Equipment used: Sliding board Transfers: Bed to chair/wheelchair/BSC            Lateral/Scoot Transfers:  Mod assist, +2 physical assistance, With slide board General transfer comment: Reviewed education on head-hip relationship and lift, shift, lower technique. Pt demonstrated improved activation pushing through LLE and fully clearing hips to scoot towards the right in order to return to bed. Assist via gait belt at trunk and bed pad.    Ambulation/Gait                   Stairs             Wheelchair Mobility     Tilt Bed    Modified Rankin (Stroke Patients Only)       Balance Overall balance assessment: Needs assistance Sitting-balance support: Single extremity supported, Feet supported Sitting balance-Leahy Scale: Fair                                      Hotel manager: Impaired Factors Affecting Communication: Difficulty expressing self  Cognition Arousal: Alert Behavior During Therapy: WFL for tasks assessed/performed   PT - Cognitive impairments: No family/caregiver present to determine baseline                         Following commands: Intact      Cueing Cueing Techniques: Verbal cues, Gestural cues, Visual cues  Exercises      General Comments        Pertinent Vitals/Pain Pain Assessment Pain Assessment: Faces Faces Pain Scale: Hurts even more Pain Location: R hip Pain Descriptors / Indicators: Aching, Discomfort, Tender Pain Intervention(s): Monitored during session, Limited activity within  patient's tolerance, Repositioned    Home Living                          Prior Function            PT Goals (current goals can now be found in the care plan section) Acute Rehab PT Goals Patient Stated Goal: Regain my independence Progress towards PT goals: Progressing toward goals    Frequency    Min 2X/week      PT Plan      Co-evaluation              AM-PAC PT 6 Clicks Mobility   Outcome Measure  Help needed turning from your back to your side while in a  flat bed without using bedrails?: A Lot Help needed moving from lying on your back to sitting on the side of a flat bed without using bedrails?: A Lot Help needed moving to and from a bed to a chair (including a wheelchair)?: Total Help needed standing up from a chair using your arms (e.g., wheelchair or bedside chair)?: Total Help needed to walk in hospital room?: Total Help needed climbing 3-5 steps with a railing? : Total 6 Click Score: 8    End of Session Equipment Utilized During Treatment: Gait belt Activity Tolerance: Patient tolerated treatment well Patient left: in bed;with call bell/phone within reach;with bed alarm set Nurse Communication: Mobility status PT Visit Diagnosis: Other abnormalities of gait and mobility (R26.89);Muscle weakness (generalized) (M62.81);Hemiplegia and hemiparesis;Pain Hemiplegia - Right/Left: Left Hemiplegia - dominant/non-dominant: Non-dominant Hemiplegia - caused by: Cerebral infarction Pain - Right/Left: Right Pain - part of body: Hip     Time: 1350-1400 PT Time Calculation (min) (ACUTE ONLY): 10 min  Charges:    $Therapeutic Activity: 8-22 mins PT General Charges $$ ACUTE PT VISIT: 1 Visit                     Randall SAUNDERS, PT, DPT Acute Rehabilitation Services Office: [phone] Secure Chat Preferred  Delon CHRISTELLA Callander 05/30/2024, 3:34 PM

## 2024-05-30 NOTE — Progress Notes (Signed)
 Physical Therapy Treatment Patient Details Name: Kent Williams MRN: [id number] DOB: 06/10/1941 Today's Date: 05/30/2024   History of Present Illness 83 y.o. male presents to [facility] hospital on 05/26/2024 with AMS, admitted for recurrent hypoglycemia. PMH includes DMII, CVA, HTN, HLD, CKDIII, BPH, prostate CA.    PT Comments  Pt greeted supine in bed, pleasant and agreeable to PT session. He was able to advance OOB mobility. Educated pt on lateral scoot transfer using sliding board. Instructed pt on lift, shift, lower technique. Cues for sequencing and to maintain RLE NWB during mobility. Pt required maxA x2 to transfer to recliner chair on left. He participated well during session and reported less pain compared to utilizing lift equipment. Patient will benefit from continued inpatient follow up therapy, <3 hours/day.     If plan is discharge home, recommend the following: Two people to help with walking and/or transfers;Two people to help with bathing/dressing/bathroom;Assistance with cooking/housework;Assist for transportation;Help with stairs or ramp for entrance   Can travel by private vehicle     No  Equipment Recommendations  Hospital bed;Hoyer lift;Other (comment) (sliding board)    Recommendations for Other Services       Precautions / Restrictions Precautions Precautions: Fall Recall of Precautions/Restrictions: Intact Restrictions Weight Bearing Restrictions Per Provider Order: Yes RUE Weight Bearing Per Provider Order: Weight bearing as tolerated RLE Weight Bearing Per Provider Order: Non weight bearing     Mobility  Bed Mobility Overal bed mobility: Needs Assistance Bed Mobility: Supine to Sit     Supine to sit: HOB elevated, Used rails, Mod assist     General bed mobility comments: Pt sat up on L side of bed with increased time. Assist to bring BLE off EOB. Cues for sequencing. Assist to elevate trunk and bring hips fwd using bed pad.    Transfers Overall  transfer level: Needs assistance Equipment used: Sliding board Transfers: Bed to chair/wheelchair/BSC            Lateral/Scoot Transfers: Max assist, +2 physical assistance, With slide board General transfer comment: Educated pt on head-hips relationship. Instructed pt on lift, shift, lower technique making sure to only WB thru LLE. Pt leaned to the R with modA for slide board to be placed underneath bottom/LE. He pushed with RUE and LLE to scoot to teh left. MaxA x2 at trunk and bed pad to bring pt to recliner chair with arm rests that move out of the way.    Ambulation/Gait                   Stairs             Wheelchair Mobility     Tilt Bed    Modified Rankin (Stroke Patients Only)       Balance Overall balance assessment: Needs assistance Sitting-balance support: No upper extremity supported, Feet supported Sitting balance-Leahy Scale: Fair                                      Hotel manager: Impaired Factors Affecting Communication: Difficulty expressing self  Cognition Arousal: Alert Behavior During Therapy: WFL for tasks assessed/performed   PT - Cognitive impairments: No family/caregiver present to determine baseline                         Following commands: Intact      Cueing Cueing Techniques: Verbal  cues, Gestural cues, Visual cues  Exercises      General Comments        Pertinent Vitals/Pain Pain Assessment Pain Assessment: Faces Faces Pain Scale: Hurts little more Pain Location: R hip Pain Descriptors / Indicators: Discomfort, Sore Pain Intervention(s): Monitored during session, Limited activity within patient's tolerance, Repositioned    Home Living                          Prior Function            PT Goals (current goals can now be found in the care plan section) Acute Rehab PT Goals Patient Stated Goal: Regain my independence Progress towards PT  goals: Progressing toward goals    Frequency    Min 2X/week      PT Plan      Co-evaluation              AM-PAC PT 6 Clicks Mobility   Outcome Measure  Help needed turning from your back to your side while in a flat bed without using bedrails?: A Lot Help needed moving from lying on your back to sitting on the side of a flat bed without using bedrails?: A Lot Help needed moving to and from a bed to a chair (including a wheelchair)?: Total Help needed standing up from a chair using your arms (e.g., wheelchair or bedside chair)?: Total Help needed to walk in hospital room?: Total Help needed climbing 3-5 steps with a railing? : Total 6 Click Score: 8    End of Session Equipment Utilized During Treatment: Gait belt Activity Tolerance: Patient tolerated treatment well Patient left: in chair;with call bell/phone within reach;with chair alarm set Nurse Communication: Mobility status;Need for lift equipment PT Visit Diagnosis: Other abnormalities of gait and mobility (R26.89);Muscle weakness (generalized) (M62.81);Hemiplegia and hemiparesis;Pain Hemiplegia - Right/Left: Left Hemiplegia - dominant/non-dominant: Non-dominant Hemiplegia - caused by: Cerebral infarction Pain - Right/Left: Right Pain - part of body: Hip     Time: 1136-1200 PT Time Calculation (min) (ACUTE ONLY): 24 min  Charges:    $Therapeutic Activity: 23-37 mins PT General Charges $$ ACUTE PT VISIT: 1 Visit                     Randall SAUNDERS, PT, DPT Acute Rehabilitation Services Office: [phone] Secure Chat Preferred  Delon CHRISTELLA Callander 05/30/2024, 1:46 PM

## 2024-05-30 NOTE — TOC Progression Note (Addendum)
 Transition of Care [facility]) - Progression Note    Patient Details  Name: Kent Williams MRN: [id number] Date of Birth: 06-Sep-1941  Transition of Care [facility]) CM/SW Contact  Bridget Cordella Simmonds, LCSW Phone Number: 05/30/2024, 10:17 AM  Clinical Narrative:   CSW spoke with Lorrene Medicine, listed as friend on facesheet.  Lorrene is [facility] aide who works with pt through [employer] [place].  He has continued to work with pt while he has been at [facility].  Prior to the [facility] in July, Lorrene worked with pt in his apartment 6 days/week between 3-5 hours per day.  Pt was able to feed himself at that time, Lorrene helped with bathing, errands, paying bills.  1110: Per denial letter from [place], once pain issues addressed and ortho consulted, can resubmit SNF auth request.  Both have been completed.  New auth request submitted.     Expected Discharge Plan: Skilled Nursing Facility Barriers to Discharge: Insurance Authorization               Expected Discharge Plan and Services In-house Referral: Clinical Social Work   Post Acute Care Choice: Skilled Nursing Facility Living arrangements for the past 2 months: Apartment                                       Social Drivers of Health (SDOH) Interventions SDOH Screenings   Food Insecurity: No Food Insecurity (05/27/2024)  Housing: Low Risk  (05/27/2024)  Transportation Needs: No Transportation Needs (05/27/2024)  Utilities: Not At Risk (05/27/2024)  Social Connections: Socially Isolated (05/27/2024)  Tobacco Use: Low Risk  (05/26/2024)    Readmission Risk Interventions     No data to display
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
## 2024-05-31 DIAGNOSIS — E162 Hypoglycemia, unspecified: Secondary | ICD-10-CM | POA: Diagnosis not present

## 2024-05-31 DIAGNOSIS — R630 Anorexia: Secondary | ICD-10-CM | POA: Diagnosis not present

## 2024-05-31 DIAGNOSIS — R1310 Dysphagia, unspecified: Secondary | ICD-10-CM | POA: Diagnosis not present

## 2024-05-31 DIAGNOSIS — N179 Acute kidney failure, unspecified: Secondary | ICD-10-CM | POA: Diagnosis not present

## 2024-05-31 LAB — BASIC METABOLIC PANEL WITH GFR
Anion gap: 17 — ABNORMAL HIGH (ref 5–15)
BUN: 33 mg/dL — ABNORMAL HIGH (ref 8–23)
CO2: 25 mmol/L (ref 22–32)
Calcium: 9.5 mg/dL (ref 8.9–10.3)
Chloride: 112 mmol/L — ABNORMAL HIGH (ref 98–111)
Creatinine, Ser: 1.71 mg/dL — ABNORMAL HIGH (ref 0.61–1.24)
GFR, Estimated: 40 mL/min — ABNORMAL LOW (ref >60)
Glucose, Bld: 117 mg/dL — ABNORMAL HIGH (ref 70–99)
Potassium: 4.4 mmol/L (ref 3.5–5.1)
Sodium: 142 mmol/L (ref 135–145)

## 2024-05-31 LAB — GLUCOSE, CAPILLARY
Glucose-Capillary: 106 mg/dL — ABNORMAL HIGH (ref 70–99)
Glucose-Capillary: 188 mg/dL — ABNORMAL HIGH (ref 70–99)

## 2024-05-31 MED ORDER — DICLOFENAC SODIUM 1 % EX GEL: 4.0000 g | Freq: Four times a day (QID) | CUTANEOUS | Status: AC

## 2024-05-31 MED ORDER — MORPHINE SULFATE ER BEADS 30 MG PO CP24: 30.0000 mg | ORAL_CAPSULE | Freq: Two times a day (BID) | ORAL | 0 refills | Status: AC

## 2024-05-31 NOTE — TOC Progression Note (Signed)
 Transition of Care [facility]) - Progression Note    Patient Details  Name: KENTRAIL SHEW MRN: [id number] Date of Birth: 1941/04/20  [facility] CM/SW Contact  Bridget Cordella Simmonds, LCSW Phone Number: 05/31/2024, 8:39 AM  Clinical Narrative:   SNF auth request approved in [place]: [id number], 5 days: 8/21-8/25.  [facility] confirms they can receive pt today. MD informed.    Expected Discharge Plan: Skilled Nursing Facility Barriers to Discharge: Insurance Authorization               Expected Discharge Plan and Services In-house Referral: Clinical Social Work   Post Acute Care Choice: Skilled Nursing Facility Living arrangements for the past 2 months: Apartment                                       Social Drivers of Health (SDOH) Interventions SDOH Screenings   Food Insecurity: No Food Insecurity (05/27/2024)  Housing: Low Risk  (05/27/2024)  Transportation Needs: No Transportation Needs (05/27/2024)  Utilities: Not At Risk (05/27/2024)  Social Connections: Socially Isolated (05/27/2024)  Tobacco Use: Low Risk  (05/26/2024)    Readmission Risk Interventions     No data to display

## 2024-05-31 NOTE — Progress Notes (Signed)
 Report called to Vernell RN at [facility].

## 2024-05-31 NOTE — TOC Transition Note (Signed)
 Transition of Care [facility]) - Discharge Note   Patient Details  Name: Kent Williams MRN: [id number] Date of Birth: 26-Mar-1941  [facility]) CM/SW Contact:  Bridget Cordella Simmonds, LCSW Phone Number: 05/31/2024, 11:42 AM   Clinical Narrative:   Pt discharging to [facility], room 203.  RN call report to [phone].  PTAR called [phone]    Final next level of care: Skilled Nursing Facility Barriers to Discharge: Barriers Resolved   Patient Goals and CMS Choice Patient states their goals for this hospitalization and ongoing recovery are:: return to my apartment   Choice offered to / list presented to : Patient (pt agreeable to return to [facility])      Discharge Placement              Patient chooses bed at: [facility] Patient to be transferred to facility by: [facility] Name of family member notified: LM with brother [name] Patient and family notified of of transfer: 05/31/24  Discharge Plan and Services Additional resources added to the After Visit Summary for   In-house Referral: Clinical Social Work   Post Acute Care Choice: Skilled Nursing Facility                               Social Drivers of Health (SDOH) Interventions SDOH Screenings   Food Insecurity: No Food Insecurity (05/27/2024)  Housing: Low Risk  (05/27/2024)  Transportation Needs: No Transportation Needs (05/27/2024)  Utilities: Not At Risk (05/27/2024)  Social Connections: Socially Isolated (05/27/2024)  Tobacco Use: Low Risk  (05/26/2024)     Readmission Risk Interventions     No data to display
